# Patient Record
Sex: Female | Born: 1975 | Race: Black or African American | Hispanic: No | Marital: Single | Smoking: Former smoker
Health system: Southern US, Community
[De-identification: ages and names within clinical notes are randomized; demographics above are authoritative.]

## PROBLEM LIST (undated history)

## (undated) ENCOUNTER — Inpatient Hospital Stay (HOSPITAL_COMMUNITY): Payer: Self-pay

## (undated) DIAGNOSIS — E282 Polycystic ovarian syndrome: Secondary | ICD-10-CM

## (undated) DIAGNOSIS — Z872 Personal history of diseases of the skin and subcutaneous tissue: Secondary | ICD-10-CM

## (undated) DIAGNOSIS — Z87898 Personal history of other specified conditions: Secondary | ICD-10-CM

## (undated) DIAGNOSIS — O24419 Gestational diabetes mellitus in pregnancy, unspecified control: Secondary | ICD-10-CM

## (undated) DIAGNOSIS — E669 Obesity, unspecified: Secondary | ICD-10-CM

## (undated) HISTORY — DX: Personal history of other specified conditions: Z87.898

## (undated) HISTORY — DX: Polycystic ovarian syndrome: E28.2

## (undated) HISTORY — DX: Personal history of diseases of the skin and subcutaneous tissue: Z87.2

## (undated) HISTORY — PX: THERAPEUTIC ABORTION: SHX798

---

## 2009-06-21 ENCOUNTER — Emergency Department (HOSPITAL_COMMUNITY): Admission: EM | Admit: 2009-06-21 | Discharge: 2009-06-22 | Payer: Self-pay | Admitting: Emergency Medicine

## 2010-08-05 ENCOUNTER — Ambulatory Visit: Payer: Self-pay | Admitting: Obstetrics and Gynecology

## 2010-08-05 ENCOUNTER — Encounter: Payer: Self-pay | Admitting: Family Medicine

## 2010-08-05 LAB — CONVERTED CEMR LAB
CO2: 28 meq/L (ref 19–32)
Chloride: 104 meq/L (ref 96–112)
Hgb A1c MFr Bld: 6 % — ABNORMAL HIGH (ref <5.7)
Pap Smear: NEGATIVE
Potassium: 3.9 meq/L (ref 3.5–5.3)

## 2010-08-06 ENCOUNTER — Encounter: Payer: Self-pay | Admitting: Family Medicine

## 2010-10-08 ENCOUNTER — Ambulatory Visit: Admit: 2010-10-08 | Payer: Self-pay | Admitting: Obstetrics and Gynecology

## 2010-12-03 ENCOUNTER — Ambulatory Visit (INDEPENDENT_AMBULATORY_CARE_PROVIDER_SITE_OTHER): Payer: Self-pay | Admitting: Obstetrics and Gynecology

## 2010-12-03 DIAGNOSIS — E282 Polycystic ovarian syndrome: Secondary | ICD-10-CM

## 2010-12-07 LAB — POCT URINALYSIS DIPSTICK
Bilirubin Urine: NEGATIVE
Ketones, ur: NEGATIVE mg/dL
Specific Gravity, Urine: 1.02 (ref 1.005–1.030)

## 2010-12-14 ENCOUNTER — Inpatient Hospital Stay (HOSPITAL_COMMUNITY)
Admission: AD | Admit: 2010-12-14 | Discharge: 2010-12-15 | Disposition: A | Discharge status: Home / Self Care | Payer: Self-pay | Source: Ambulatory Visit | Attending: Obstetrics & Gynecology | Admitting: Obstetrics & Gynecology

## 2010-12-14 ENCOUNTER — Inpatient Hospital Stay (HOSPITAL_COMMUNITY): Payer: Self-pay

## 2010-12-14 DIAGNOSIS — N946 Dysmenorrhea, unspecified: Secondary | ICD-10-CM

## 2010-12-14 LAB — URINALYSIS, ROUTINE W REFLEX MICROSCOPIC
Leukocytes, UA: NEGATIVE
Nitrite: NEGATIVE
Specific Gravity, Urine: 1.015 (ref 1.005–1.030)
Urobilinogen, UA: 0.2 mg/dL (ref 0.0–1.0)
pH: 7 (ref 5.0–8.0)

## 2010-12-14 LAB — URINE MICROSCOPIC-ADD ON

## 2010-12-14 LAB — POCT PREGNANCY, URINE: Preg Test, Ur: NEGATIVE

## 2010-12-17 NOTE — Progress Notes (Unsigned)
NAMEANNAMAY, LAYMON NO.:  [id number]  MEDICAL RECORD NO.:  [id number]           PATIENT TYPE:  A  LOCATION:  [facility]                   FACILITY:  [facility]  PHYSICIAN:  Argentina Donovan, MD        DATE OF BIRTH:  1976-09-06  DATE OF SERVICE:  12/03/2010                                 CLINIC NOTE  The patient is a 35 year old gravida 3, para 0-0-3-0 with 3 teenage abortions.  She has been diagnosed with polycystic ovarian syndrome in [place], was treated with Provera and Clomid, did not quite understand. I do not think how she was taking.  We spent a lot of time with her discussing the problem, discussing the Clomid and how to use it.  We are going to put her on a temperature chart using a basal temperature thermometer.  We are going to have her take her Provera for 5 days twice a day.  When she starts her period, she is going to use 2 Clomid.  She is on one for many months, one twice a day for 5 days from day 5 through 9 of each cycle.  If she does not have a period after she takes the Clomid, she comes in.  If she does have a period, she starts again doing the same procedure, but staying on the temperature chart.  I have told her I need the temperature chart as much as I need her to come in.  IMPRESSION:  Polycystic ovarian syndrome.          ______________________________ Argentina Donovan, MD    PR/MEDQ  D:  12/03/2010  T:  12/04/2010  Job:  [id number]

## 2010-12-31 LAB — URINALYSIS, ROUTINE W REFLEX MICROSCOPIC
Bilirubin Urine: NEGATIVE
Ketones, ur: NEGATIVE mg/dL
Protein, ur: NEGATIVE mg/dL
Specific Gravity, Urine: 1.027 (ref 1.005–1.030)
Urobilinogen, UA: 1 mg/dL (ref 0.0–1.0)

## 2010-12-31 LAB — DIFFERENTIAL
Lymphs Abs: 1.4 10*3/uL (ref 0.7–4.0)
Monocytes Relative: 7 % (ref 3–12)
Neutro Abs: 4.5 10*3/uL (ref 1.7–7.7)
Neutrophils Relative %: 69 % (ref 43–77)

## 2010-12-31 LAB — BASIC METABOLIC PANEL
Calcium: 9 mg/dL (ref 8.4–10.5)
Chloride: 108 mEq/L (ref 96–112)
Creatinine, Ser: 0.87 mg/dL (ref 0.4–1.2)
GFR calc Af Amer: >60 mL/min (ref >60)
Sodium: 138 mEq/L (ref 135–145)

## 2010-12-31 LAB — CBC
MCV: 88.7 fL (ref 78.0–100.0)
RBC: 4.43 MIL/uL (ref 3.87–5.11)
WBC: 6.4 10*3/uL (ref 4.0–10.5)

## 2010-12-31 LAB — URINE MICROSCOPIC-ADD ON

## 2010-12-31 LAB — PREGNANCY, URINE: Preg Test, Ur: NEGATIVE

## 2011-03-21 ENCOUNTER — Ambulatory Visit (INDEPENDENT_AMBULATORY_CARE_PROVIDER_SITE_OTHER): Payer: Self-pay | Admitting: Obstetrics and Gynecology

## 2011-03-21 DIAGNOSIS — E282 Polycystic ovarian syndrome: Secondary | ICD-10-CM

## 2011-03-21 DIAGNOSIS — N912 Amenorrhea, unspecified: Secondary | ICD-10-CM

## 2011-03-22 NOTE — Group Therapy Note (Signed)
Danielle, Huerta NO.:  [id number]  MEDICAL RECORD NO.:  [id number]           PATIENT TYPE:  A  LOCATION:  [facility]                   FACILITY:  [facility]  PHYSICIAN:  Argentina Donovan, MD        DATE OF BIRTH:  [birth year]  DATE OF SERVICE:  03/21/2011                                 CLINIC NOTE  The patient is a polycystic ovarian syndrome patient gravida 3, para 0-0- 3-0 with 3 teenage abortions diagnosed with polycystic ovarian syndrome, treated with Provera and Clomid.  Did not quite understand how to take it.  We discussed a long time ago how to take this with her.  She is on a temperature chart, which she forgot to bring in today.  She said she took the Clomid b.i.d. May 13th to 17th and has had no period since.  We are going to get a beta HCG today, if negative we will start her on Provera again for 5 days b.i.d. 10 mg followed by Clomid 50 t.i.d. 5 to 9.  We will do that for 2 months.  We will have her come in again with her temperature chart and decide whether she needs HCG if that fails or there is nothing further we can do for her in this clinic.  IMPRESSION:  Polycystic ovarian syndrome anovulation.          ______________________________ Argentina Donovan, MD    PR/MEDQ  D:  03/21/2011  T:  03/22/2011  Job:  [id number]

## 2011-04-18 ENCOUNTER — Encounter (HOSPITAL_COMMUNITY): Payer: Self-pay | Admitting: *Deleted

## 2011-04-18 ENCOUNTER — Inpatient Hospital Stay (HOSPITAL_COMMUNITY)
Admission: AD | Admit: 2011-04-18 | Discharge: 2011-04-18 | Disposition: A | Discharge status: Home / Self Care | Payer: Self-pay | Source: Ambulatory Visit | Attending: Obstetrics & Gynecology | Admitting: Obstetrics & Gynecology

## 2011-04-18 DIAGNOSIS — B9689 Other specified bacterial agents as the cause of diseases classified elsewhere: Secondary | ICD-10-CM | POA: Insufficient documentation

## 2011-04-18 DIAGNOSIS — F17200 Nicotine dependence, unspecified, uncomplicated: Secondary | ICD-10-CM | POA: Insufficient documentation

## 2011-04-18 DIAGNOSIS — N760 Acute vaginitis: Secondary | ICD-10-CM | POA: Insufficient documentation

## 2011-04-18 DIAGNOSIS — A499 Bacterial infection, unspecified: Secondary | ICD-10-CM | POA: Insufficient documentation

## 2011-04-18 LAB — URINALYSIS, ROUTINE W REFLEX MICROSCOPIC
Glucose, UA: NEGATIVE mg/dL
Specific Gravity, Urine: >1.03 — ABNORMAL HIGH (ref 1.005–1.030)

## 2011-04-18 LAB — WET PREP, GENITAL
Trich, Wet Prep: NONE SEEN
Yeast Wet Prep HPF POC: NONE SEEN

## 2011-04-18 MED ORDER — METRONIDAZOLE 500 MG PO TABS: 500.0000 mg | ORAL_TABLET | Freq: Two times a day (BID) | ORAL | Status: AC

## 2011-04-18 NOTE — ED Provider Notes (Signed)
History   Chief Complaint:  Vaginal Discharge   Danielle Huerta is  35 y.o. G3P0030 No LMP recorded..  Her pregnancy status is negative. She presents complaining of Vaginal Discharge . Onset is described as insidious and has been present for  2 days.   OB History    Grav Para Term Preterm Abortions TAB SAB Ect Mult Living   3 0 0 0 3 0 0 0 0 0        Past Medical History  Diagnosis Date  . No pertinent past medical history     Past Surgical History  Procedure Date  . Therapeutic abortion     No family history on file.  History  Substance Use Topics  . Smoking status: Current Everyday Smoker -- 0.5 packs/day for 15 years  . Smokeless tobacco: Never Used  . Alcohol Use: Yes    Allergies:  Allergies  Allergen Reactions  . Amoxicillin Itching    No prescriptions prior to admission    Review of Systems - Negative except vaginal discharge  Physical Exam   Blood pressure 122/75, pulse 71, temperature 98.1 F (36.7 C), temperature source Oral, resp. rate 18, height 5\' 6"  (1.676 m), weight 231 lb 6.4 oz (104.962 kg).  General: General appearance - alert, well appearing, and in no distress and oriented to person, place, and time Mental status - alert, oriented to person, place, and time Abdomen - soft, nontender, nondistended, no masses or organomegaly Back exam - full range of motion, no tenderness, palpable spasm or pain on motion Neurological - alert, oriented, normal speech, no focal findings or movement disorder noted Musculoskeletal - no joint tenderness, deformity or swelling Focused Gynecological Exam: normal external genitalia, vulva, vagina, cervix, uterus and adnexa, small white creamy d/c in vault, No CMT, no adnexal tenderness, uterus non tender/non enlarged  Labs: Recent Results (from the past 24 hour(s))  URINALYSIS, ROUTINE W REFLEX MICROSCOPIC   Collection Time   04/18/11  1:05 PM      Component Value Range   Color, Urine YELLOW  YELLOW    Appearance CLOUDY (*) CLEAR    Specific Gravity, Urine >1.030 (*) 1.005 - 1.030    pH 5.5  5.0 - 8.0    Glucose, UA NEGATIVE  NEGATIVE (mg/dL)   Hgb urine dipstick SMALL (*) NEGATIVE    Bilirubin Urine SMALL (*) NEGATIVE    Ketones, ur NEGATIVE  NEGATIVE (mg/dL)   Protein, ur NEGATIVE  NEGATIVE (mg/dL)   Urobilinogen, UA 0.2  0.0 - 1.0 (mg/dL)   Nitrite POSITIVE (*) NEGATIVE    Leukocytes, UA SMALL (*) NEGATIVE   URINE MICROSCOPIC-ADD ON   Collection Time   04/18/11  1:05 PM      Component Value Range   Squamous Epithelial / LPF FEW (*) RARE    WBC, UA 11-20  <3 (WBC/hpf)   Bacteria, UA MANY (*) RARE    Urine-Other MUCOUS PRESENT    WET PREP, GENITAL   Collection Time   04/18/11  1:11 PM      Component Value Range   Yeast, Wet Prep NONE SEEN  NONE SEEN    Trich, Wet Prep NONE SEEN  NONE SEEN    Clue Cells, Wet Prep FEW (*) NONE SEEN    WBC, Wet Prep HPF POC MODERATE (*) NONE SEEN   GC/CHLAMYDIA PROBE AMP, GENITAL   Collection Time   04/18/11  1:11 PM      Component Value Range   GC Probe Amp, Genital NEGATIVE  NEGATIVE    Chlamydia, DNA Probe NEGATIVE  NEGATIVE   POCT PREGNANCY, URINE   Collection Time   04/18/11  1:11 PM      Component Value Range   Preg Test, Ur NEGATIVE      Assessment: Patient Active Problem List  Diagnoses  . Bacterial vaginal infection     Plan: Rx Flagyl 500mg  po BID x 7 days, ETOH precautions reviewed FU in Gyn clinic as needed   Discharge Medications: Flagyl    Danielle Huerta E. 04/19/2011, 9:42 AM

## 2011-04-18 NOTE — Progress Notes (Signed)
2days ago noted urine has an odor, about same time noted a thick, white non- odorous vag d/c.  Denies itching or burning.  +frequency with urination, no urgency or pain

## 2011-04-18 NOTE — ED Notes (Signed)
Spotted on Sat. LMP was in May,  Saw Dr in June- neg blood preg test

## 2011-04-19 LAB — GC/CHLAMYDIA PROBE AMP, GENITAL: GC Probe Amp, Genital: NEGATIVE

## 2011-09-14 ENCOUNTER — Ambulatory Visit: Payer: Self-pay | Admitting: Advanced Practice Midwife

## 2012-06-27 ENCOUNTER — Encounter (HOSPITAL_COMMUNITY): Payer: Self-pay | Admitting: *Deleted

## 2012-06-27 ENCOUNTER — Inpatient Hospital Stay (HOSPITAL_COMMUNITY)
Admission: AD | Admit: 2012-06-27 | Discharge: 2012-06-27 | Disposition: A | Discharge status: Home / Self Care | Payer: BC Managed Care – PPO | Source: Ambulatory Visit | Attending: Obstetrics & Gynecology | Admitting: Obstetrics & Gynecology

## 2012-06-27 ENCOUNTER — Inpatient Hospital Stay (HOSPITAL_COMMUNITY): Payer: BC Managed Care – PPO

## 2012-06-27 DIAGNOSIS — N938 Other specified abnormal uterine and vaginal bleeding: Secondary | ICD-10-CM | POA: Insufficient documentation

## 2012-06-27 DIAGNOSIS — N949 Unspecified condition associated with female genital organs and menstrual cycle: Secondary | ICD-10-CM

## 2012-06-27 LAB — WET PREP, GENITAL
Trich, Wet Prep: NONE SEEN
Yeast Wet Prep HPF POC: NONE SEEN

## 2012-06-27 LAB — CBC
MCHC: 32.9 g/dL (ref 30.0–36.0)
Platelets: 238 10*3/uL (ref 150–400)
RDW: 12.6 % (ref 11.5–15.5)
WBC: 5.2 10*3/uL (ref 4.0–10.5)

## 2012-06-27 LAB — URINALYSIS, ROUTINE W REFLEX MICROSCOPIC
Glucose, UA: NEGATIVE mg/dL
Protein, ur: NEGATIVE mg/dL
pH: 6 (ref 5.0–8.0)

## 2012-06-27 LAB — URINE MICROSCOPIC-ADD ON

## 2012-06-27 LAB — POCT PREGNANCY, URINE: Preg Test, Ur: NEGATIVE

## 2012-06-27 MED ORDER — NORGESTIMATE-ETH ESTRADIOL 0.25-35 MG-MCG PO TABS: 1.0000 | ORAL_TABLET | Freq: Every day | ORAL | Status: DC

## 2012-06-27 MED ORDER — IBUPROFEN 600 MG PO TABS: 600.0000 mg | ORAL_TABLET | Freq: Four times a day (QID) | ORAL | Status: DC | PRN

## 2012-06-27 NOTE — MAU Provider Note (Signed)
Agree with note ARNOLD,JAMES  

## 2012-06-27 NOTE — MAU Note (Signed)
Bleeding X 2 weeks. On Sunday, 9/28, clots were coming out every 5 mins. "the size of my hand." Since then has been medium-heavy flow. States has never happened before. Not on any type of birth control. Wants to become pregnant.

## 2012-06-27 NOTE — MAU Provider Note (Signed)
History     CSN: [id number]  Arrival date and time: 06/27/12 1622   First Provider Initiated Contact with Patient 06/27/12 1917      Chief Complaint  Patient presents with  . Vaginal Bleeding   HPI Danielle Huerta is a 36 y.o. female who presents to MAU with vaginal bleeding. The bleeding started 2 weeks ago. She describes the bleeding as going from heavy to light and then 4 days ago  back to heavy and  passing clots. Last normal period 05/25/12. Last pap smear about a year ago and was normal at [facility]. Current sex partner x 3 years, no birth control. No history of STI's. No pain with intercourse. The history was provided by the patient.  OB History    Grav Para Term Preterm Abortions TAB SAB Ect Mult Living   3 0 0 0 3 3 0 0 0 0       Past Medical History  Diagnosis Date  . Diabetes mellitus     elevated A1C, to F/U with family MD    Past Surgical History  Procedure Date  . Therapeutic abortion     History reviewed. No pertinent family history.  History  Substance Use Topics  . Smoking status: Current Every Day Smoker -- 0.2 packs/day for 15 years  . Smokeless tobacco: Never Used  . Alcohol Use: Yes     socially    Allergies:  Allergies  Allergen Reactions  . Amoxicillin Itching    Prescriptions prior to admission  Medication Sig Dispense Refill  . acetaminophen (TYLENOL) 500 MG tablet Take 1,000 mg by mouth every 6 (six) hours as needed. For pain      . ibuprofen (ADVIL,MOTRIN) 200 MG tablet Take 400 mg by mouth every 6 (six) hours as needed. For pain        Review of Systems  Constitutional: Negative for fever, chills and weight loss.  HENT: Negative for ear pain, nosebleeds, congestion, sore throat and neck pain.   Eyes: Negative for blurred vision, double vision, photophobia and pain.  Respiratory: Negative for cough, shortness of breath and wheezing.   Cardiovascular: Negative for chest pain, palpitations and leg swelling.  Gastrointestinal:  Negative for heartburn, nausea, vomiting, abdominal pain (mild cramping), diarrhea and constipation.  Genitourinary: Negative for dysuria, urgency and frequency.  Musculoskeletal: Negative for myalgias and back pain.  Skin: Negative for itching and rash.  Neurological: Negative for dizziness, sensory change, speech change, seizures, weakness and headaches.  Endo/Heme/Allergies: Does not bruise/bleed easily.  Psychiatric/Behavioral: Negative for depression. The patient is not nervous/anxious and does not have insomnia.    Blood pressure 121/78, pulse 90, temperature 98 F (36.7 C), temperature source Oral, resp. rate 18, height 5' 4.5" (1.638 m), weight 237 lb (107.502 kg), SpO2 99.00%.  Physical Exam  Nursing note and vitals reviewed. Constitutional: She is oriented to person, place, and time. She appears well-developed and well-nourished. No distress.  HENT:  Head: Normocephalic and atraumatic.  Eyes: EOM are normal.  Neck: Neck supple.  Cardiovascular: Normal rate.   Respiratory: Effort normal.  GI: Soft. There is no tenderness.  Genitourinary:       External genitalia without lesions. Frothy, bloody discharge vaginal vault. No CMT, no adnexal tenderness. Uterus without palpable enlargement.  Musculoskeletal: Normal range of motion.  Neurological: She is alert and oriented to person, place, and time.  Skin: Skin is warm and dry.  Psychiatric: She has a normal mood and affect. Her behavior is normal. Judgment and thought  content normal.   Procedures  Results for orders placed during the hospital encounter of 06/27/12 (from the past 24 hour(s))  URINALYSIS, ROUTINE W REFLEX MICROSCOPIC     Status: Abnormal   Collection Time   06/27/12  4:49 PM      Component Value Range   Color, Urine YELLOW  YELLOW   APPearance CLEAR  CLEAR   Specific Gravity, Urine 1.010  1.005 - 1.030   pH 6.0  5.0 - 8.0   Glucose, UA NEGATIVE  NEGATIVE mg/dL   Hgb urine dipstick LARGE (*) NEGATIVE    Bilirubin Urine NEGATIVE  NEGATIVE   Ketones, ur NEGATIVE  NEGATIVE mg/dL   Protein, ur NEGATIVE  NEGATIVE mg/dL   Urobilinogen, UA 0.2  0.0 - 1.0 mg/dL   Nitrite NEGATIVE  NEGATIVE   Leukocytes, UA TRACE (*) NEGATIVE  URINE MICROSCOPIC-ADD ON     Status: Abnormal   Collection Time   06/27/12  4:49 PM      Component Value Range   Squamous Epithelial / LPF MANY (*) RARE   WBC, UA 3-6  <3 WBC/hpf   RBC / HPF 7-10  <3 RBC/hpf   Bacteria, UA MANY (*) RARE   Urine-Other MUCOUS PRESENT    CBC     Status: Normal   Collection Time   06/27/12  4:55 PM      Component Value Range   WBC 5.2  4.0 - 10.5 K/uL   RBC 4.29  3.87 - 5.11 MIL/uL   Hemoglobin 12.1  12.0 - 15.0 g/dL   HCT 40.9  81.1 - 91.4 %   MCV 85.8  78.0 - 100.0 fL   MCH 28.2  26.0 - 34.0 pg   MCHC 32.9  30.0 - 36.0 g/dL   RDW 78.2  95.6 - 21.3 %   Platelets 238  150 - 400 K/uL  POCT PREGNANCY, URINE     Status: Normal   Collection Time   06/27/12  5:26 PM      Component Value Range   Preg Test, Ur NEGATIVE  NEGATIVE   Assessment: 36 y.o. female with abnormal vaginal bleeding  Plan:  Patient awaiting ultrasound, care turned over to D. Mahayla Haddaway, CNM  NEESE,HOPE, RN, FNP, [facility] 06/27/2012, 7:17 PM   *RADIOLOGY REPORT*  Clinical Data: 36 year old female with vaginal bleeding. Negative  pregnancy test.  TRANSABDOMINAL AND TRANSVAGINAL ULTRASOUND OF PELVIS  Technique: Both transabdominal and transvaginal ultrasound  examinations of the pelvis were performed. Transabdominal technique  was performed for global imaging of the pelvis including uterus,  ovaries, adnexal regions, and pelvic cul-de-sac.  It was necessary to proceed with endovaginal exam following the  transabdominal exam to visualize the endometrium and ovaries.  Comparison: 12/14/2010  Findings:  Uterus: The uterus is anteverted measuring 7.8 x 3.5 x 5.1 cm. No  myometrial masses are identified.  Endometrium: Focal thickening of the endometrium is identified in  the  lower uterine segment measuring 11 mm in greatest diameter.  The remainder of the endometrium measures 4 mm in thickness.  Right ovary: The right ovary is unremarkable measuring 3.5 x 2.1 x  2.6 cm.  Left ovary: The left ovary is unremarkable measuring 3.2 x 2.3 x  2.7 cm.  Other findings: There is no evidence of free fluid or adnexal mass.  IMPRESSION:  Focal thickening of the endometrium in the lower uterine segment -  suspicious for focal lesion/polyp. Recommend further evaluation /  sampling.  Original Report Authenticated By: Rosendo Gros, M.D.  D/W Dr. Debroah Loop: prescribed combination oral contraceptives to take 2 a day for 7 days then take one a day and to skip the placebo week. She has an appointment October 24 with [facility]. Advised to take ibuprofen up to 600 mg every 6 hours if needed for cramping. [place] result and  AVS on DUB reviewed Danae Orleans, CNM 06/27/2012 9:51 PM

## 2012-07-30 ENCOUNTER — Other Ambulatory Visit: Payer: Self-pay | Admitting: Obstetrics and Gynecology

## 2012-08-10 ENCOUNTER — Other Ambulatory Visit: Payer: Self-pay | Admitting: Obstetrics and Gynecology

## 2012-08-13 ENCOUNTER — Encounter (HOSPITAL_COMMUNITY): Payer: Self-pay | Admitting: Pharmacist

## 2012-08-15 MED ORDER — METRONIDAZOLE IN NACL 5-0.79 MG/ML-% IV SOLN
500.0000 mg | INTRAVENOUS | Status: AC
  Administered 2012-08-16: 500 mg via INTRAVENOUS
  Filled 2012-08-15: qty 100

## 2012-08-15 MED ORDER — CIPROFLOXACIN IN D5W 400 MG/200ML IV SOLN
400.0000 mg | INTRAVENOUS | Status: AC
  Administered 2012-08-16: 400 mg via INTRAVENOUS
  Filled 2012-08-15: qty 200

## 2012-08-16 ENCOUNTER — Encounter (HOSPITAL_COMMUNITY): Payer: Self-pay | Admitting: Anesthesiology

## 2012-08-16 ENCOUNTER — Ambulatory Visit (HOSPITAL_COMMUNITY): Payer: BC Managed Care – PPO | Admitting: Anesthesiology

## 2012-08-16 ENCOUNTER — Encounter (HOSPITAL_COMMUNITY)
Admission: RE | Disposition: A | Discharge status: Home / Self Care | Payer: Self-pay | Source: Ambulatory Visit | Attending: Obstetrics and Gynecology

## 2012-08-16 ENCOUNTER — Ambulatory Visit (HOSPITAL_COMMUNITY)
Admission: RE | Admit: 2012-08-16 | Discharge: 2012-08-16 | Disposition: A | Discharge status: Home / Self Care | Payer: BC Managed Care – PPO | Source: Ambulatory Visit | Attending: Obstetrics and Gynecology | Admitting: Obstetrics and Gynecology

## 2012-08-16 ENCOUNTER — Encounter (HOSPITAL_COMMUNITY): Payer: Self-pay | Admitting: *Deleted

## 2012-08-16 DIAGNOSIS — N840 Polyp of corpus uteri: Secondary | ICD-10-CM | POA: Insufficient documentation

## 2012-08-16 DIAGNOSIS — N938 Other specified abnormal uterine and vaginal bleeding: Secondary | ICD-10-CM | POA: Insufficient documentation

## 2012-08-16 DIAGNOSIS — N949 Unspecified condition associated with female genital organs and menstrual cycle: Secondary | ICD-10-CM | POA: Insufficient documentation

## 2012-08-16 DIAGNOSIS — N920 Excessive and frequent menstruation with regular cycle: Secondary | ICD-10-CM | POA: Insufficient documentation

## 2012-08-16 DIAGNOSIS — Z9889 Other specified postprocedural states: Secondary | ICD-10-CM

## 2012-08-16 HISTORY — PX: DILATATION & CURRETTAGE/HYSTEROSCOPY WITH RESECTOCOPE: SHX5572

## 2012-08-16 LAB — HCG, SERUM, QUALITATIVE: Preg, Serum: NEGATIVE

## 2012-08-16 LAB — CBC
Platelets: 306 10*3/uL (ref 150–400)
RBC: 3.94 MIL/uL (ref 3.87–5.11)
RDW: 13.5 % (ref 11.5–15.5)
WBC: 4.9 10*3/uL (ref 4.0–10.5)

## 2012-08-16 SURGERY — DILATATION & CURETTAGE/HYSTEROSCOPY WITH RESECTOCOPE
Anesthesia: General | Wound class: Clean Contaminated

## 2012-08-16 MED ORDER — PROPOFOL 10 MG/ML IV EMUL
INTRAVENOUS | Status: AC
  Filled 2012-08-16: qty 20

## 2012-08-16 MED ORDER — LACTATED RINGERS IV SOLN
INTRAVENOUS | Status: DC
  Administered 2012-08-16 (×2): via INTRAVENOUS

## 2012-08-16 MED ORDER — FENTANYL CITRATE 0.05 MG/ML IJ SOLN
25.0000 ug | INTRAMUSCULAR | Status: DC | PRN
  Administered 2012-08-16: 50 ug via INTRAVENOUS

## 2012-08-16 MED ORDER — DEXAMETHASONE SODIUM PHOSPHATE 10 MG/ML IJ SOLN
INTRAMUSCULAR | Status: AC
  Filled 2012-08-16: qty 1

## 2012-08-16 MED ORDER — METOCLOPRAMIDE HCL 5 MG/ML IJ SOLN: 10.0000 mg | Freq: Once | INTRAMUSCULAR | Status: DC | PRN

## 2012-08-16 MED ORDER — KETOROLAC TROMETHAMINE 30 MG/ML IJ SOLN
INTRAMUSCULAR | Status: AC
  Filled 2012-08-16: qty 1

## 2012-08-16 MED ORDER — FENTANYL CITRATE 0.05 MG/ML IJ SOLN
INTRAMUSCULAR | Status: AC
  Filled 2012-08-16: qty 4

## 2012-08-16 MED ORDER — MIDAZOLAM HCL 5 MG/5ML IJ SOLN
INTRAMUSCULAR | Status: DC | PRN
  Administered 2012-08-16: 2 mg via INTRAVENOUS

## 2012-08-16 MED ORDER — LIDOCAINE HCL (CARDIAC) 20 MG/ML IV SOLN
INTRAVENOUS | Status: AC
  Filled 2012-08-16: qty 5

## 2012-08-16 MED ORDER — MEPERIDINE HCL 25 MG/ML IJ SOLN: 6.2500 mg | INTRAMUSCULAR | Status: DC | PRN

## 2012-08-16 MED ORDER — PROPOFOL 10 MG/ML IV EMUL
INTRAVENOUS | Status: DC | PRN
  Administered 2012-08-16: 200 mg via INTRAVENOUS

## 2012-08-16 MED ORDER — SODIUM CHLORIDE 0.9 % IR SOLN
Status: DC | PRN
  Administered 2012-08-16: 3000 mL

## 2012-08-16 MED ORDER — ONDANSETRON HCL 4 MG/2ML IJ SOLN
INTRAMUSCULAR | Status: DC | PRN
  Administered 2012-08-16: 4 mg via INTRAVENOUS

## 2012-08-16 MED ORDER — DEXAMETHASONE SODIUM PHOSPHATE 10 MG/ML IJ SOLN
INTRAMUSCULAR | Status: DC | PRN
  Administered 2012-08-16: 10 mg via INTRAVENOUS

## 2012-08-16 MED ORDER — MIDAZOLAM HCL 2 MG/2ML IJ SOLN
INTRAMUSCULAR | Status: AC
  Filled 2012-08-16: qty 2

## 2012-08-16 MED ORDER — LIDOCAINE HCL (CARDIAC) 20 MG/ML IV SOLN
INTRAVENOUS | Status: DC | PRN
  Administered 2012-08-16: 50 mg via INTRAVENOUS

## 2012-08-16 MED ORDER — ONDANSETRON HCL 4 MG/2ML IJ SOLN
INTRAMUSCULAR | Status: AC
  Filled 2012-08-16: qty 2

## 2012-08-16 MED ORDER — FENTANYL CITRATE 0.05 MG/ML IJ SOLN
INTRAMUSCULAR | Status: DC | PRN
  Administered 2012-08-16: 100 ug via INTRAVENOUS

## 2012-08-16 MED ORDER — LIDOCAINE HCL 2 % IJ SOLN
INTRAMUSCULAR | Status: DC | PRN
  Administered 2012-08-16: 2 mL

## 2012-08-16 MED ORDER — LIDOCAINE HCL 2 % IJ SOLN
INTRAMUSCULAR | Status: AC
  Filled 2012-08-16: qty 20

## 2012-08-16 MED ORDER — KETOROLAC TROMETHAMINE 30 MG/ML IJ SOLN
INTRAMUSCULAR | Status: DC | PRN
  Administered 2012-08-16: 30 mg via INTRAVENOUS

## 2012-08-16 SURGICAL SUPPLY — 19 items
BLADE INCISOR TRUC PLUS 2.9 (ABLATOR) ×1 IMPLANT
CANISTER SUCTION 2500CC (MISCELLANEOUS) ×2 IMPLANT
CATH ROBINSON RED A/P 16FR (CATHETERS) ×2 IMPLANT
CLOTH BEACON ORANGE TIMEOUT ST (SAFETY) ×2 IMPLANT
CONTAINER PREFILL 10% NBF 60ML (FORM) ×4 IMPLANT
DILATOR CANAL MILEX (MISCELLANEOUS) IMPLANT
DRESSING TELFA 8X3 (GAUZE/BANDAGES/DRESSINGS) ×2 IMPLANT
GLOVE BIO SURGEON STRL SZ 6.5 (GLOVE) ×2 IMPLANT
GLOVE BIOGEL PI IND STRL 7.0 (GLOVE) ×5 IMPLANT
GLOVE BIOGEL PI INDICATOR 7.0 (GLOVE) ×5
GLOVE NEODERM STER SZ 7 (GLOVE) ×4 IMPLANT
GOWN STRL REIN XL XLG (GOWN DISPOSABLE) ×6 IMPLANT
INCISOR TRUC PLUS BLADE 2.9 (ABLATOR) ×2
KIT HYSTEROSCOPY TRUCLEAR (ABLATOR) ×2 IMPLANT
LOOP ANGLED CUTTING 22FR (CUTTING LOOP) IMPLANT
PACK HYSTEROSCOPY LF (CUSTOM PROCEDURE TRAY) ×2 IMPLANT
PAD OB MATERNITY 4.3X12.25 (PERSONAL CARE ITEMS) ×2 IMPLANT
TOWEL OR 17X24 6PK STRL BLUE (TOWEL DISPOSABLE) ×4 IMPLANT
WATER STERILE IRR 1000ML POUR (IV SOLUTION) ×2 IMPLANT

## 2012-08-16 NOTE — Transfer of Care (Signed)
Immediate Anesthesia Transfer of Care Note  Patient: Danielle Huerta  Procedure(s) Performed: Procedure(s) (LRB) with comments: DILATATION & CURETTAGE/HYSTEROSCOPY WITH RESECTOCOPE (N/A)  Patient Location: PACU  Anesthesia Type:General  Level of Consciousness: awake, alert , oriented and patient cooperative  Airway & Oxygen Therapy: Patient Spontanous Breathing and Patient connected to nasal cannula oxygen  Post-op Assessment: Report given to PACU RN and Post -op Vital signs reviewed and stable  Post vital signs: Reviewed and stable  Complications: No apparent anesthesia complications

## 2012-08-16 NOTE — H&P (View-Only) (Signed)
Patient ID: Danielle Huerta, female   DOB: 05/10/1976, 36 y.o.   MRN: [id number]  07/30/2012  History of Present Illness  General:          36 y/o G2A2 presents for EMB due to dysfunctional bleeding. Pt has been on OCP taper and Provera previously to manage bleeing. Ultrasound shows a focal mass, possibly a polyp.         Pt has recently used Clomid for infertility due to a known h/o PCOS.        Currently denies SOB,dizziness or chest pain. Denies pelvic pain.    Current Medications  Norgestimate-Eth Estradiol 0.25-35 MG-MCG Tablet 1 tablet Once a day, stop date 07/23/2012  Provera 10 MG Tablet 1 tablet once daily  Medication List reviewed and reconciled with the patient      Past Medical History  Infertility  Hirsutism      Surgical History  Denies Past Surgical History      Family History  Father: deceased Diabetes   Mother: alive   Maternal aunt: alive Breast cancer       Social History  General:         History of smoking               cigarettes:  Current smoker             Frequency:  1/2 PPD       Smoking: yes.        Alcohol: yes, Rare.        no Recreational drug use.        Exercise: yes, 2-3 x weekly.        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        Marital Status: Divorced, committed relationship.        Children: none.        Religion: Baptist.        Seat belt use: yes.     Gyn History  Sexual activity  currently sexually active.   Periods :  every other month until 05/20/12, bled until 05/30/12, restarted 06/16/12 and bled til 07/22/12.   LMP  07/22/12.   Birth control  ocp.   Last pap smear date  1 year ago.   Denies H/O Last mammogram date  .   Abnormal pap smear  none.   STD  none.      OB History  abortion  2.   Pregnancy # 1  abortion at 1-2 months, D&C, was age 16.   Pregnancy # 2  abortion at less than month, D&C.      Allergies  N.K.D.A.      Hospitalization/Major Diagnostic Procedure    Denies Past Hospitalization      Review of Systems  See HPI. See HPI.    Vital Signs  Wt 230, Ht 65.25, BMI 37.98, Pulse sitting 91, BP sitting 129/80.    Physical Examination  GENERAL:          Patient appears  alert and oriented.          General Appearance:  well-appearing, well-developed, no acute distress.          Speech:  clear.   FEMALE GENITOURINARY:          Cervix  visualized, healthy appearing, no discharge, no lesions.          Adnexa:  no mass, non tender.          Uterus:    normal size/shape/consistency, freely mobile, non tender.          Vagina:  pink/moist mucosa, no lesions, no abnormal discharge.          Vulva:  normal, no lesions, no skin discoloration.          Anus:  no external hemosrhoids.   EXTREMITIES:          general  no edema.           Assessments  1. Dysfunctional Uterine Bleeding - 626.8 (Primary)  2. Pregnancy test negative - V72.41    Treatment  1. Dysfunctional Uterine Bleeding   Continue Provera Tablet, 10 MG, 1 tablet, Orally, once daily, 30 days, 30, Refills 0 Discussed R/B/A of hysteroscopy D&C. Will proceed if EMB is normal. All questions answered.      2. Pregnancy test negative        LAB: Pregnancy Test, Urine     Pregnancy Test, Urine negative               Allman,Michelle 07/30/2012 11:32:47 AM > Dr. V infomred.        Procedures  Endometrial Biopsy:          Consent UTP negative, Informed consent obtained.          Prep cervix was prepped with betadine.          Procedure Cervix was grasped with single tooth tenaculum, uterus was sounded to 7-8 cm, a 4 mm pipet was advanced without difficulty, with good return of tissue.          Post procedure Patient tolerated procedure well.          Indication DUB, endometrial mass.         Procedure Codes  81025 URINE PREGNANCY TEST      Follow Up  2 Weeks post op    History of Present Illness  General:          36 y/o G2A2 presents for EMB due to dysfunctional bleeding.  Pt has been on OCP taper and Provera previously to manage bleeing. Ultrasound shows a focal mass, possibly a polyp.         Pt has recently used Clomid for infertility due to a known h/o PCOS.        Currently denies SOB,dizziness or chest pain. Denies pelvic pain.    Current Medications  Norgestimate-Eth Estradiol 0.25-35 MG-MCG Tablet 1 tablet Once a day, stop date 07/23/2012  Provera 10 MG Tablet 1 tablet once daily  Medication List reviewed and reconciled with the patient      Past Medical History  Infertility  Hirsutism      Surgical History  Denies Past Surgical History      Family History  Father: deceased Diabetes   Mother: alive   Maternal aunt: alive Breast cancer       Social History  General:         History of smoking               cigarettes:  Current smoker             Frequency:  1/2 PPD       Smoking: yes.        Alcohol: yes, Rare.        no Recreational drug use.        Exercise: yes, 2-3 x weekly.        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        Marital Status: Divorced, committed relationship.        Children: none.        Religion: Baptist.        Seat belt use: yes.     Gyn History  Sexual activity  currently sexually active.   Periods :  every other month until 05/20/12, bled until 05/30/12, restarted 06/16/12 and bled til 07/22/12.   LMP  07/22/12.   Birth control  ocp.   Last pap smear date  1 year ago.   Denies H/O Last mammogram date  .   Abnormal pap smear  none.   STD  none.      OB History  abortion  2.   Pregnancy # 1  abortion at 1-2 months, D&C, was age 16.   Pregnancy # 2  abortion at less than month, D&C.      Allergies  N.K.D.A.      Hospitalization/Major Diagnostic Procedure  Denies Past Hospitalization      Review of Systems  See HPI. See HPI.    Vital Signs  Wt 230, Ht 65.25, BMI 37.98, Pulse sitting 91, BP sitting 129/80.    Physical Examination  GENERAL:           Patient appears  alert and oriented.          General Appearance:  well-appearing, well-developed, no acute distress.          Speech:  clear.   FEMALE GENITOURINARY:          Cervix  visualized, healthy appearing, no discharge, no lesions.          Adnexa:  no mass, non tender.          Uterus:  normal size/shape/consistency, freely mobile, non tender.          Vagina:  pink/moist mucosa, no lesions, no abnormal discharge.          Vulva:  normal, no lesions, no skin discoloration.          Anus:  no external hemosrhoids.   EXTREMITIES:          general  no edema.           Assessments  1. Dysfunctional Uterine Bleeding - 626.8 (Primary)  2. Pregnancy test negative - V72.41    Treatment  1. Dysfunctional Uterine Bleeding   Continue Provera Tablet, 10 MG, 1 tablet, Orally, once daily, 30 days, 30, Refills 0 Discussed R/B/A of hysteroscopy D&C. Will proceed if EMB is normal. All questions answered.      2. Pregnancy test negative        LAB: Pregnancy Test, Urine     Pregnancy Test, Urine negative               Allman,Michelle 07/30/2012 11:32:47 AM > Dr. V infomred.        Procedures  Endometrial Biopsy:          Consent UTP negative, Informed consent obtained.          Prep cervix was prepped with betadine.          Procedure Cervix was grasped with single tooth tenaculum, uterus was sounded to 7-8 cm, a 4 mm pipet was advanced without difficulty, with good return of tissue.          Post procedure Patient tolerated procedure well.          Indication DUB, endometrial mass.           Procedure Codes  81025 URINE PREGNANCY TEST      Follow Up  2 Weeks post op     

## 2012-08-16 NOTE — Anesthesia Postprocedure Evaluation (Signed)
  Anesthesia Post-op Note  Patient: Danielle Huerta  Procedure(s) Performed: Procedure(s) (LRB) with comments: DILATATION & CURETTAGE/HYSTEROSCOPY WITH RESECTOCOPE (N/A)  Patient Location: PACU  Anesthesia Type:General  Level of Consciousness: awake, alert  and oriented  Airway and Oxygen Therapy: Patient Spontanous Breathing  Post-op Pain: none  Post-op Assessment: Post-op Vital signs reviewed, Patient's Cardiovascular Status Stable, Respiratory Function Stable, Patent Airway, No signs of Nausea or vomiting and Pain level controlled  Post-op Vital Signs: Reviewed and stable  Complications: No apparent anesthesia complications

## 2012-08-16 NOTE — Progress Notes (Signed)
Patient ID: Danielle Huerta, female   DOB: 10-04-1975, 36 y.o.   MRN: [id number]  07/30/2012  History of Present Illness  General:          36 y/o G2A2 presents for EMB due to dysfunctional bleeding. Pt has been on OCP taper and Provera previously to manage bleeing. Ultrasound shows a focal mass, possibly a polyp.         Pt has recently used Clomid for infertility due to a known h/o PCOS.        Currently denies SOB,dizziness or chest pain. Denies pelvic pain.    Current Medications  Norgestimate-Eth Estradiol 0.25-35 MG-MCG Tablet 1 tablet Once a day, stop date 07/23/2012  Provera 10 MG Tablet 1 tablet once daily  Medication List reviewed and reconciled with the patient      Past Medical History  Infertility  Hirsutism      Surgical History  Denies Past Surgical History      Family History  Father: deceased Diabetes   Mother: alive   Maternal aunt: alive Breast cancer       Social History  General:         History of smoking               cigarettes:  Current smoker             Frequency:  1/2 PPD       Smoking: yes.        Alcohol: yes, Rare.        no Recreational drug use.        Exercise: yes, 2-3 x weekly.        Occupation: Radiation protection practitioner for [employer], works with mentally challenged adults.        Marital Status: Divorced, committed relationship.        Children: none.        Religion: Baptist.        Seat belt use: yes.     Gyn History  Sexual activity  currently sexually active.   Periods :  every other month until 05/20/12, bled until 05/30/12, restarted 06/16/12 and bled til 07/22/12.   LMP  07/22/12.   Birth control  ocp.   Last pap smear date  1 year ago.   Denies H/O Last mammogram date  .   Abnormal pap smear  none.   STD  none.      OB History  abortion  2.   Pregnancy # 1  abortion at 1-2 months, D&C, was age 62.   Pregnancy # 2  abortion at less than month, D&C.      Allergies  N.K.D.A.      Hospitalization/Major Diagnostic Procedure    Denies Past Hospitalization      Review of Systems  See HPI. See HPI.    Vital Signs  Wt 230, Ht 65.25, BMI 37.98, Pulse sitting 91, BP sitting 129/80.    Physical Examination  GENERAL:          Patient appears  alert and oriented.          General Appearance:  well-appearing, well-developed, no acute distress.          Speech:  clear.   FEMALE GENITOURINARY:          Cervix  visualized, healthy appearing, no discharge, no lesions.          Adnexa:  no mass, non tender.          Uterus:  normal size/shape/consistency, freely mobile, non tender.          Vagina:  pink/moist mucosa, no lesions, no abnormal discharge.          Vulva:  normal, no lesions, no skin discoloration.          Anus:  no external hemosrhoids.   EXTREMITIES:          general  no edema.           Assessments  1. Dysfunctional Uterine Bleeding - 626.8 (Primary)  2. Pregnancy test negative - V72.41    Treatment  1. Dysfunctional Uterine Bleeding   Continue Provera Tablet, 10 MG, 1 tablet, Orally, once daily, 30 days, 30, Refills 0 Discussed R/B/A of hysteroscopy D&C. Will proceed if EMB is normal. All questions answered.      2. Pregnancy test negative        LAB: Pregnancy Test, Urine     Pregnancy Test, Urine negative               Allman,Michelle 07/30/2012 11:32:47 AM > Dr. Seth Bake infomred.        Procedures  Endometrial Biopsy:          Consent UTP negative, Informed consent obtained.          Prep cervix was prepped with betadine.          Procedure Cervix was grasped with single tooth tenaculum, uterus was sounded to 7-8 cm, a 4 mm pipet was advanced without difficulty, with good return of tissue.          Post procedure Patient tolerated procedure well.          Indication DUB, endometrial mass.         Procedure Codes  14782 URINE PREGNANCY TEST      Follow Up  2 Weeks post op    History of Present Illness  General:          36 y/o G2A2 presents for EMB due to dysfunctional bleeding.  Pt has been on OCP taper and Provera previously to manage bleeing. Ultrasound shows a focal mass, possibly a polyp.         Pt has recently used Clomid for infertility due to a known h/o PCOS.        Currently denies SOB,dizziness or chest pain. Denies pelvic pain.    Current Medications  Norgestimate-Eth Estradiol 0.25-35 MG-MCG Tablet 1 tablet Once a day, stop date 07/23/2012  Provera 10 MG Tablet 1 tablet once daily  Medication List reviewed and reconciled with the patient      Past Medical History  Infertility  Hirsutism      Surgical History  Denies Past Surgical History      Family History  Father: deceased Diabetes   Mother: alive   Maternal aunt: alive Breast cancer       Social History  General:         History of smoking               cigarettes:  Current smoker             Frequency:  1/2 PPD       Smoking: yes.        Alcohol: yes, Rare.        no Recreational drug use.        Exercise: yes, 2-3 x weekly.        Occupation: Radiation protection practitioner for [employer], works with mentally  challenged adults.        Marital Status: Divorced, committed relationship.        Children: none.        Religion: Baptist.        Seat belt use: yes.     Gyn History  Sexual activity  currently sexually active.   Periods :  every other month until 05/20/12, bled until 05/30/12, restarted 06/16/12 and bled til 07/22/12.   LMP  07/22/12.   Birth control  ocp.   Last pap smear date  1 year ago.   Denies H/O Last mammogram date  .   Abnormal pap smear  none.   STD  none.      OB History  abortion  2.   Pregnancy # 1  abortion at 1-2 months, D&C, was age 89.   Pregnancy # 2  abortion at less than month, D&C.      Allergies  N.K.D.A.      Hospitalization/Major Diagnostic Procedure  Denies Past Hospitalization      Review of Systems  See HPI. See HPI.    Vital Signs  Wt 230, Ht 65.25, BMI 37.98, Pulse sitting 91, BP sitting 129/80.    Physical Examination  GENERAL:           Patient appears  alert and oriented.          General Appearance:  well-appearing, well-developed, no acute distress.          Speech:  clear.   FEMALE GENITOURINARY:          Cervix  visualized, healthy appearing, no discharge, no lesions.          Adnexa:  no mass, non tender.          Uterus:  normal size/shape/consistency, freely mobile, non tender.          Vagina:  pink/moist mucosa, no lesions, no abnormal discharge.          Vulva:  normal, no lesions, no skin discoloration.          Anus:  no external hemosrhoids.   EXTREMITIES:          general  no edema.           Assessments  1. Dysfunctional Uterine Bleeding - 626.8 (Primary)  2. Pregnancy test negative - V72.41    Treatment  1. Dysfunctional Uterine Bleeding   Continue Provera Tablet, 10 MG, 1 tablet, Orally, once daily, 30 days, 30, Refills 0 Discussed R/B/A of hysteroscopy D&C. Will proceed if EMB is normal. All questions answered.      2. Pregnancy test negative        LAB: Pregnancy Test, Urine     Pregnancy Test, Urine negative               Allman,Michelle 07/30/2012 11:32:47 AM > Dr. Seth Bake infomred.        Procedures  Endometrial Biopsy:          Consent UTP negative, Informed consent obtained.          Prep cervix was prepped with betadine.          Procedure Cervix was grasped with single tooth tenaculum, uterus was sounded to 7-8 cm, a 4 mm pipet was advanced without difficulty, with good return of tissue.          Post procedure Patient tolerated procedure well.          Indication DUB, endometrial mass.  Procedure Codes  08657 URINE PREGNANCY TEST      Follow Up  2 Weeks post op

## 2012-08-16 NOTE — Interval H&P Note (Signed)
History and Physical Interval Note:  08/16/2012 3:05 PM  Danielle Huerta  has presented today for surgery, with the diagnosis of Dysfunctional Uterine Bleeding  The various methods of treatment have been discussed with the patient and family. After consideration of risks, benefits and other options for treatment, the patient has consented to Hysteroscopy/D&C/ Trueclear-Endometrial mass, DUB as a surgical intervention .  The patient's history has been reviewed, patient examined, no change in status, stable for surgery. CV:  RRR, Lungs: CTA Bilaterally, abdomen soft, nontender. I have reviewed the patient's chart and labs.  Questions were answered to the patient's satisfaction.    Bleeding has stopped.  Taking Necon one tablet daily.  Dion Body, Elta Angell

## 2012-08-16 NOTE — Brief Op Note (Signed)
08/16/2012  4:05 PM  PATIENT:  Danielle Huerta  36 y.o. female  PRE-OPERATIVE DIAGNOSIS:  Menometrorrhagia; Dysfunctional Uterine Bleeding; Endometrial Polyp  POST-OPERATIVE DIAGNOSIS:  Menometrorrhagia; Dysfunctional Uterine Bleeding; Endometrial Polyp  PROCEDURE:  Hysteroscopy/D&C/Endometrial polypectomy and "currettage" with Trueclear.  SURGEON:  Surgeon(s) and Role:    * Geryl Rankins, MD - Primary  PHYSICIAN ASSISTANT: None   ASSISTANTS: none   ANESTHESIA:   General LMA  EBL:  Total I/O In: -  Out: 50 [Urine:50]  BLOOD ADMINISTERED:none  DRAINS: none   LOCAL MEDICATIONS USED:  LIDOCAINE 2% 2 cc  SPECIMEN:  Source of Specimen:  Endometrial polyp and endometrial shavings  DISPOSITION OF SPECIMEN:  PATHOLOGY  COUNTS:  YES  TOURNIQUET:  * No tourniquets in log *  DICTATION: .Other Dictation: Dictation Number [id number]  PLAN OF CARE: Discharge to home after PACU  PATIENT DISPOSITION:  PACU - hemodynamically stable.   Delay start of Pharmacological VTE agent (>24hrs) due to surgical blood loss or risk of bleeding: not applicable

## 2012-08-16 NOTE — Anesthesia Preprocedure Evaluation (Addendum)
Anesthesia Evaluation  Patient identified by MRN, date of birth, ID band Patient awake    Reviewed: Allergy & Precautions, H&P , NPO status , Patient's Chart, lab work & pertinent test results  Airway Mallampati: III TM Distance: >3 FB Neck ROM: Full    Dental No notable dental hx. (+) Teeth Intact   Pulmonary neg pulmonary ROS,  breath sounds clear to auscultation  Pulmonary exam normal       Cardiovascular negative cardio ROS  Rhythm:Regular Rate:Normal     Neuro/Psych negative neurological ROS  negative psych ROS   GI/Hepatic negative GI ROS, Neg liver ROS,   Endo/Other  negative endocrine ROS  Renal/GU negative Renal ROS  negative genitourinary   Musculoskeletal negative musculoskeletal ROS (+)   Abdominal (+) + obese,   Peds  Hematology negative hematology ROS (+)   Anesthesia Other Findings   Reproductive/Obstetrics DUB                          Anesthesia Physical Anesthesia Plan  ASA: II  Anesthesia Plan: General   Post-op Pain Management:    Induction: Intravenous  Airway Management Planned: LMA  Additional Equipment:   Intra-op Plan:   Post-operative Plan: Extubation in OR  Informed Consent: I have reviewed the patients History and Physical, chart, labs and discussed the procedure including the risks, benefits and alternatives for the proposed anesthesia with the patient or authorized representative who has indicated his/her understanding and acceptance.   Dental advisory given  Plan Discussed with: CRNA, Anesthesiologist and Surgeon  Anesthesia Plan Comments:         Anesthesia Quick Evaluation

## 2012-08-17 ENCOUNTER — Encounter (HOSPITAL_COMMUNITY): Payer: Self-pay | Admitting: Obstetrics and Gynecology

## 2012-08-17 NOTE — Op Note (Signed)
Danielle Huerta, Danielle Huerta NO.:  [id number]  MEDICAL RECORD NO.:  [id number]  LOCATION:  [facility]                          FACILITY:  [facility]  PHYSICIAN:  Pieter Partridge, MD   DATE OF BIRTH:  08/24/1976  DATE OF PROCEDURE:  08/16/2012 DATE OF DISCHARGE:  08/16/2012                              OPERATIVE REPORT   PREOPERATIVE DIAGNOSES:  Dysfunctional uterine bleeding and endometrial polyp, menometrorrhagia.  PROCEDURES:  Hysteroscopy, D and C, endometrial polypectomy, and curettage with TRUCLEAR device.  SURGEON:  Pieter Partridge, MD.  ASSISTANT:  None.  ANESTHESIA:  General and LMA.  EBL:  Minimal.  URINE:  50 out prior to procedure.  SPECIMEN:  Endometrial polyp and endometrial shavings local with 2% lidocaine 2 mL.  DISPOSITION:  To PACU hemodynamically stable.  COMPLICATIONS:  None.  FINDINGS:  The patient had an endometrial polyp in the right cornu adjacent to the right ostia.  The left ostia was normal.  Normal uterine cavity.  No septum.  Lots of moderate amount of endometrial tissue.  A few areas appeared to have some darker areas consistent with blood clots.  PROCEDURE IN DETAIL:  Danielle Huerta was identified in the holding area.  She was then taken to the operating room, where she underwent LMA anesthesia without complication.  She was placed in the dorsal lithotomy position and prepped and draped in a normal sterile fashion.  I and O catheterization of the bladder was performed.  Graves speculum was inserted into the vagina and the anterior lip of the cervix was visualized.  The patient did have some relaxation of the vaginal walls.  The anterior lip of the cervix was then injected with 2% lidocaine, and a single-tooth tenaculum was applied.  The cervix was dilated up to a 5 Hegar.  The hysteroscope was then advanced to reveal the findings above.  The TRUCLEAR device was then inserted.  There was a little resistance getting it into the  hysteroscope initially, but throughout the case it slid back and forth normally.  The endometrial polyp was in the corner of the uterus.  The patient was laid down during the procedure due to the angling and the length needed for the hysteroscope to reach that area.  The TRUCLEAR device was advanced to the polyp and it was resected instantly, very soft-appearing mass the remaining portion of the cavity.  Due to the patient's previous history of 2 D and Cs and her desire to get pregnant, I decided to do the sampling of the rest of the endometrium with the TRUCLEAR device shaving the endometrium and so further for the remainder of the case, I cleaned up the uterus and got rid of all of the excessive endometrial tissue noted and at the end of the procedure the cavity appeared to be very clean and normal.  All structures were easily identified.  The patient had most of the tissue in the mid portion of the uterus anterior and posterior.  The hysteroscope was then removed from the uterus.  Single tooth tenaculum was removed from the anterior lip of the cervix.  Hemostasis was achieved with silver nitrate.  Graves speculum was then removed. Deficit  of fluid was 200.  The cervical os was without any active bleeding.  The patient tolerated the procedure well.  She got Flagyl and Cipro prior to procedure.  She had SCDs on and running.  She awakened in the recovery room in stable condition.     Pieter Partridge, MD     EBV/MEDQ  D:  08/16/2012  T:  08/17/2012  Job:  [id number]

## 2013-04-02 ENCOUNTER — Encounter (HOSPITAL_COMMUNITY): Payer: Self-pay | Admitting: *Deleted

## 2013-04-02 ENCOUNTER — Emergency Department (HOSPITAL_COMMUNITY)
Admission: EM | Admit: 2013-04-02 | Discharge: 2013-04-02 | Disposition: A | Discharge status: Home / Self Care | Payer: Self-pay | Attending: Emergency Medicine | Admitting: Emergency Medicine

## 2013-04-02 ENCOUNTER — Emergency Department (HOSPITAL_COMMUNITY): Payer: Self-pay

## 2013-04-02 DIAGNOSIS — S8990XA Unspecified injury of unspecified lower leg, initial encounter: Secondary | ICD-10-CM | POA: Insufficient documentation

## 2013-04-02 DIAGNOSIS — F17200 Nicotine dependence, unspecified, uncomplicated: Secondary | ICD-10-CM | POA: Insufficient documentation

## 2013-04-02 DIAGNOSIS — X58XXXA Exposure to other specified factors, initial encounter: Secondary | ICD-10-CM | POA: Insufficient documentation

## 2013-04-02 DIAGNOSIS — Y9389 Activity, other specified: Secondary | ICD-10-CM | POA: Insufficient documentation

## 2013-04-02 DIAGNOSIS — Y929 Unspecified place or not applicable: Secondary | ICD-10-CM | POA: Insufficient documentation

## 2013-04-02 DIAGNOSIS — M79675 Pain in left toe(s): Secondary | ICD-10-CM

## 2013-04-02 MED ORDER — IBUPROFEN 800 MG PO TABS
800.0000 mg | ORAL_TABLET | Freq: Once | ORAL | Status: AC
  Administered 2013-04-02: 800 mg via ORAL
  Filled 2013-04-02: qty 1

## 2013-04-02 NOTE — ED Notes (Signed)
Pt c/o left 2nd toe pain, reports she is unsure if she injured it. Reports pain started on Sunday. Pt reports pain is worse with weight bearing.

## 2013-04-02 NOTE — ED Notes (Signed)
Pt reports L 2nd toe pain since Sunday, denies any injury at this time.  Pt reports pain is worst with weight bearing.  Swelling noted.  Pt reports pain radiates up to her ankle at times.  Pt is able to move toe but not much.

## 2013-04-02 NOTE — ED Provider Notes (Signed)
History  This chart was scribed for non-physician practitioner working with Geoffery Lyons, MD by Greggory Stallion, ED scribe. This patient was seen in room WTR6/WTR6 and the patient's care was started at 9:20 PM.  CSN: [id number] Arrival date & time 04/02/13  1935   Chief Complaint  Patient presents with  . Toe Injury   The history is provided by the patient. No language interpreter was used.    HPI Comments: Danielle Huerta is a 37 y.o. female who presents to the Emergency Department complaining of sudden onset, constant throbbing 2nd toe pain that started on Sunday. Pain varies from moderate to severe with weight bearing. Pt states she thinks she may have stubbed it, but it wasn't bothering her until a few hours ago. Pt took no interventions.    History reviewed. No pertinent past medical history. Past Surgical History  Procedure Laterality Date  . Therapeutic abortion    . Dilatation & currettage/hysteroscopy with resectocope  08/16/2012    Procedure: DILATATION & CURETTAGE/HYSTEROSCOPY WITH RESECTOCOPE;  Surgeon: Geryl Rankins, MD;  Location: [facility];  Service: Gynecology;  Laterality: N/A;   History reviewed. No pertinent family history. History  Substance Use Topics  . Smoking status: Current Every Day Smoker -- 0.25 packs/day for 15 years    Types: Cigarettes  . Smokeless tobacco: Never Used  . Alcohol Use: Yes     Comment: socially   OB History   Grav Para Term Preterm Abortions TAB SAB Ect Mult Living   3 0 0 0 3 3 0 0 0 0      Review of Systems  Constitutional: Negative for fever and diaphoresis.  HENT: Negative for neck pain and neck stiffness.   Eyes: Negative for visual disturbance.  Respiratory: Negative for apnea, chest tightness and shortness of breath.   Cardiovascular: Negative for chest pain and palpitations.  Gastrointestinal: Negative for nausea, vomiting, diarrhea and constipation.  Genitourinary: Negative for dysuria.  Musculoskeletal: Positive for  arthralgias. Negative for gait problem.       Left 2nd toe  Skin: Positive for color change. Negative for rash.       bruising  Neurological: Negative for dizziness, weakness, light-headedness, numbness and headaches.    Allergies  Amoxicillin  Home Medications  No current outpatient prescriptions on file.  BP 132/75  Pulse 91  Temp(Src) 99.8 F (37.7 C) (Oral)  Resp 18  Ht 5\' 6"  (1.676 m)  SpO2 99%  Physical Exam  Nursing note and vitals reviewed. Constitutional: She is oriented to person, place, and time. She appears well-developed and well-nourished. No distress.  HENT:  Head: Normocephalic and atraumatic.  Eyes: Conjunctivae and EOM are normal.  Neck: Normal range of motion. Neck supple.  No meningeal signs  Cardiovascular: Normal rate, regular rhythm, normal heart sounds and intact distal pulses.  Exam reveals no gallop and no friction rub.   No murmur heard. Pulmonary/Chest: Effort normal and breath sounds normal. No respiratory distress. She has no wheezes. She has no rales. She exhibits no tenderness.  Abdominal: Soft. Bowel sounds are normal. She exhibits no distension. There is no tenderness. There is no rebound and no guarding.  Musculoskeletal: Normal range of motion. She exhibits edema and tenderness.  FROM to upper and lower extremities. Tenderness to palpation at proximal phalanx of 2nd toe on left foot. Mild swelling of dorsum of left foot.    Neurological: She is alert and oriented to person, place, and time. No cranial nerve deficit.  Sensation normal to light touch  and two point discrimination Moves extremities without ataxia, coordination intact Gait altered by slight limp secondary to pain. Normal balance Normal strength in upper and lower extremities bilaterally including dorsiflexion and plantar flexion, strong and equal grip strength   Skin: Skin is warm and dry. She is not diaphoretic. No erythema.  bruising to proximal phalanx of 2nd toe of left  foot.   Psychiatric: She has a normal mood and affect.    ED Course  Procedures (including critical care time)  DIAGNOSTIC STUDIES: Oxygen Saturation is 99% on RA, normal by my interpretation.    COORDINATION OF CARE: 10:17 PM-Discussed treatment plan which includes ibuprofen and post op shoe with pt at bedside and pt agreed to plan.   Labs Reviewed - No data to display Dg Toe 2nd Left  04/02/2013   *RADIOLOGY REPORT*  Clinical Data: Left second toe injury  LEFT SECOND TOE  Comparison: None.  Findings: Three views of the left second toes submitted.  No acute fracture or subluxation.  No radiopaque foreign body.  IMPRESSION: .  No acute fracture or subluxation.   Original Report Authenticated By: Natasha Mead, M.D.   1. Toe pain, left     MDM  Neurovascularly intact. Imaging shows no fracture. Directed pt to ice injury, take acetaminophen or ibuprofen for pain, and to elevate and rest the injury when possible. Buddy taped toe, post op shoe, and crutches. Discussed reasons to seek immediate care. Patient expresses understanding and agrees with plan.  I personally performed the services described in this documentation, which was scribed in my presence. The recorded information has been reviewed and is accurate.  Glade Nurse, PA-C 04/03/13 0040

## 2013-04-03 NOTE — ED Provider Notes (Signed)
Medical screening examination/treatment/procedure(s) were performed by non-physician practitioner and as supervising physician I was immediately available for consultation/collaboration.  Geoffery Lyons, MD 04/03/13 1758

## 2013-08-21 ENCOUNTER — Encounter (HOSPITAL_COMMUNITY): Payer: Self-pay | Admitting: Emergency Medicine

## 2013-08-21 ENCOUNTER — Emergency Department (HOSPITAL_COMMUNITY)
Admission: EM | Admit: 2013-08-21 | Discharge: 2013-08-21 | Disposition: A | Discharge status: Home / Self Care | Payer: BC Managed Care – PPO | Attending: Emergency Medicine | Admitting: Emergency Medicine

## 2013-08-21 DIAGNOSIS — H9209 Otalgia, unspecified ear: Secondary | ICD-10-CM | POA: Insufficient documentation

## 2013-08-21 DIAGNOSIS — Z880 Allergy status to penicillin: Secondary | ICD-10-CM | POA: Insufficient documentation

## 2013-08-21 DIAGNOSIS — F17200 Nicotine dependence, unspecified, uncomplicated: Secondary | ICD-10-CM | POA: Insufficient documentation

## 2013-08-21 DIAGNOSIS — K089 Disorder of teeth and supporting structures, unspecified: Secondary | ICD-10-CM | POA: Insufficient documentation

## 2013-08-21 DIAGNOSIS — K0500 Acute gingivitis, plaque induced: Secondary | ICD-10-CM

## 2013-08-21 DIAGNOSIS — K029 Dental caries, unspecified: Secondary | ICD-10-CM

## 2013-08-21 DIAGNOSIS — K0381 Cracked tooth: Secondary | ICD-10-CM | POA: Insufficient documentation

## 2013-08-21 DIAGNOSIS — K0510 Chronic gingivitis, plaque induced: Secondary | ICD-10-CM | POA: Insufficient documentation

## 2013-08-21 DIAGNOSIS — H9202 Otalgia, left ear: Secondary | ICD-10-CM

## 2013-08-21 MED ORDER — ACETAMINOPHEN 500 MG PO TABS
1000.0000 mg | ORAL_TABLET | Freq: Once | ORAL | Status: AC
  Administered 2013-08-21: 1000 mg via ORAL
  Filled 2013-08-21: qty 2

## 2013-08-21 MED ORDER — TRAMADOL HCL 50 MG PO TABS
50.0000 mg | ORAL_TABLET | Freq: Once | ORAL | Status: AC
  Administered 2013-08-21: 50 mg via ORAL
  Filled 2013-08-21: qty 1

## 2013-08-21 MED ORDER — TRAMADOL HCL 50 MG PO TABS: 50.0000 mg | ORAL_TABLET | Freq: Four times a day (QID) | ORAL | Status: DC | PRN

## 2013-08-21 MED ORDER — CLINDAMYCIN HCL 150 MG PO CAPS: 150.0000 mg | ORAL_CAPSULE | Freq: Four times a day (QID) | ORAL | Status: DC

## 2013-08-21 MED ORDER — ACETAMINOPHEN 500 MG PO TABS: 500.0000 mg | ORAL_TABLET | Freq: Four times a day (QID) | ORAL | Status: DC | PRN

## 2013-08-21 NOTE — ED Notes (Addendum)
Pt in stating she has had a left earache/ toothache x2-3 days, pain radiating into face, denies fever at home

## 2013-08-21 NOTE — ED Provider Notes (Signed)
CSN: [id number]     Arrival date & time 08/21/13  2102 History  This chart was scribed for non-physician practitioner Junius Finner, PA-C working with Hilario Quarry, MD by Danella Maiers, ED Scribe. This patient was seen in room TR08C/TR08C and the patient's care was started at 9:10 PM.   Chief Complaint  Patient presents with  . Otalgia   The history is provided by the patient. No language interpreter was used.   HPI Comments: Danielle Huerta is a 37 y.o. female who presents to the Emergency Department complaining of left ear pain that radiates down into her left cheek for the past 2-3 days. She reports associated left upper molar pain. She reports having a cracked tooth in this area. She has been taking aleve and Anbesol with no relief. She states the severity of the pain is an 8-9/10. She does not have a dentist or a family doctor. She denies fevers, nausea, vomiting, dyspnea, trouble swallowing.   History reviewed. No pertinent past medical history. Past Surgical History  Procedure Laterality Date  . Therapeutic abortion    . Dilatation & currettage/hysteroscopy with resectocope  08/16/2012    Procedure: DILATATION & CURETTAGE/HYSTEROSCOPY WITH RESECTOCOPE;  Surgeon: Geryl Rankins, MD;  Location: [facility];  Service: Gynecology;  Laterality: N/A;   History reviewed. No pertinent family history. History  Substance Use Topics  . Smoking status: Current Every Day Smoker -- 0.25 packs/day for 15 years    Types: Cigarettes  . Smokeless tobacco: Never Used  . Alcohol Use: Yes     Comment: socially   OB History   Grav Para Term Preterm Abortions TAB SAB Ect Mult Living   3 0 0 0 3 3 0 0 0 0      Review of Systems  HENT: Positive for dental problem and ear pain.   All other systems reviewed and are negative.    Allergies  Amoxicillin  Home Medications   Current Outpatient Rx  Name  Route  Sig  Dispense  Refill  . naproxen sodium (ANAPROX) 220 MG tablet   Oral   Take 440 mg by  mouth daily as needed (for pain).         Marland Kitchen acetaminophen (TYLENOL) 500 MG tablet   Oral   Take 1 tablet (500 mg total) by mouth every 6 (six) hours as needed.   30 tablet   0   . clindamycin (CLEOCIN) 150 MG capsule   Oral   Take 1 capsule (150 mg total) by mouth every 6 (six) hours.   28 capsule   0   . traMADol (ULTRAM) 50 MG tablet   Oral   Take 1 tablet (50 mg total) by mouth every 6 (six) hours as needed.   15 tablet   0    BP 164/107  Pulse 94  Temp(Src) 97.3 F (36.3 C) (Oral)  Resp 20  Wt 227 lb (102.967 kg)  SpO2 100% Physical Exam  Nursing note and vitals reviewed. Constitutional: She is oriented to person, place, and time. She appears well-developed and well-nourished.  HENT:  Head: Normocephalic and atraumatic.  Right Ear: Hearing, tympanic membrane, external ear and ear canal normal.  Left Ear: Hearing, tympanic membrane, external ear and ear canal normal.  Nose: Nose normal.  Mouth/Throat: Uvula is midline, oropharynx is clear and moist and mucous membranes are normal. She does not have dentures. No oral lesions. No trismus in the jaw. Abnormal dentition. Dental caries present. No dental abscesses, uvula swelling or lacerations.  Missing tooth in left upper jaw. Inflamed tender gingiva with multiple surrounding dental carries.    Eyes: EOM are normal.  Neck: Normal range of motion.  Cardiovascular: Normal rate.   Pulmonary/Chest: Effort normal.  Musculoskeletal: Normal range of motion.  Neurological: She is alert and oriented to person, place, and time.  Skin: Skin is warm and dry.  Psychiatric: She has a normal mood and affect. Her behavior is normal.    ED Course  Procedures (including critical care time) Medications  traMADol (ULTRAM) tablet 50 mg (50 mg Oral Given 08/21/13 2153)  acetaminophen (TYLENOL) tablet 1,000 mg (1,000 mg Oral Given 08/21/13 2153)    DIAGNOSTIC STUDIES: Oxygen Saturation is 100% on RA, normal by my  interpretation.    COORDINATION OF CARE: 9:42 PM- Discussed treatment plan with pt which includes discharge home with pain medication and antibiotics. Advised to follow up with Dr. Russella Dar, DDS, in 24-48 hours. Pt agrees to plan.    Labs Review Labs Reviewed - No data to display Imaging Review No results found.  EKG Interpretation   None       MDM   1. Acute otalgia, left   2. Pain due to dental caries   3. Gingivitis, acute     I personally performed the services described in this documentation, which was scribed in my presence. The recorded information has been reviewed and is accurate.    Junius Finner, PA-C 08/22/13 0007

## 2013-08-24 ENCOUNTER — Encounter (HOSPITAL_COMMUNITY): Payer: Self-pay | Admitting: Emergency Medicine

## 2013-08-24 DIAGNOSIS — E669 Obesity, unspecified: Secondary | ICD-10-CM | POA: Insufficient documentation

## 2013-08-24 DIAGNOSIS — Z880 Allergy status to penicillin: Secondary | ICD-10-CM | POA: Insufficient documentation

## 2013-08-24 DIAGNOSIS — R51 Headache: Secondary | ICD-10-CM | POA: Insufficient documentation

## 2013-08-24 DIAGNOSIS — K029 Dental caries, unspecified: Secondary | ICD-10-CM | POA: Insufficient documentation

## 2013-08-24 DIAGNOSIS — F17200 Nicotine dependence, unspecified, uncomplicated: Secondary | ICD-10-CM | POA: Insufficient documentation

## 2013-08-24 NOTE — ED Notes (Signed)
Pt. reports left ear ache for 2 days with slight headache .

## 2013-08-25 ENCOUNTER — Emergency Department (HOSPITAL_COMMUNITY)
Admission: EM | Admit: 2013-08-25 | Discharge: 2013-08-25 | Disposition: A | Discharge status: Home / Self Care | Payer: BC Managed Care – PPO | Attending: Emergency Medicine | Admitting: Emergency Medicine

## 2013-08-25 DIAGNOSIS — K029 Dental caries, unspecified: Secondary | ICD-10-CM

## 2013-08-25 HISTORY — DX: Obesity, unspecified: E66.9

## 2013-08-25 MED ORDER — HYDROCODONE-ACETAMINOPHEN 5-325 MG PO TABS
1.0000 | ORAL_TABLET | Freq: Once | ORAL | Status: AC
  Administered 2013-08-25: 1 via ORAL
  Filled 2013-08-25: qty 1

## 2013-08-25 MED ORDER — HYDROCODONE-ACETAMINOPHEN 5-325 MG PO TABS: ORAL_TABLET | ORAL | Status: DC

## 2013-08-25 NOTE — ED Provider Notes (Signed)
History/physical exam/procedure(s) were performed by non-physician practitioner and as supervising physician I was immediately available for consultation/collaboration. I have reviewed all notes and am in agreement with care and plan.   Joi Leyva S Dearl Rudden, MD 08/25/13 1522 

## 2013-08-25 NOTE — Discharge Instructions (Signed)
 Dental Caries  Dental caries (also called tooth decay) is the most common oral disease. It can occur at any age, but is more common in children and young adults.  HOW DENTAL CARIES DEVELOPS  The process of decay begins when bacteria and foods (particularly sugars and starches) combine in your mouth to produce plaque. Plaque is a substance that sticks to the hard, outer surface of a tooth (enamel). The bacteria in plaque produce acids that attack enamel. These acids may also attack the root surface of a tooth (cementum) if it is exposed. Repeated attacks dissolve these surfaces and create holes in the tooth (cavities). If left untreated, the acids destroy the other layers of the tooth.  RISK FACTORS  Frequent sipping of sugary beverages.   Frequent snacking on sugary and starchy foods, especially those that easily get stuck in the teeth.   Poor oral hygiene.   Dry mouth.   Substance abuse such as methamphetamine abuse.   Broken or poor-fitting dental restorations.   Eating disorders.   Gastroesophageal reflux disease (GERD).   Certain radiation treatments to the head and neck. SYMPTOMS In the early stages of dental caries, symptoms are seldom present. Sometimes white, chalky areas may be seen on the enamel or other tooth layers. In later stages, symptoms may include:  Pits and holes on the enamel.  Toothache after sweet, hot, or cold foods or drinks are consumed.  Pain around the tooth.  Swelling around the tooth. DIAGNOSIS  Most of the time, dental caries is detected during a regular dental checkup. A diagnosis is made after a thorough medical and dental history is taken and the surfaces of your teeth are checked for signs of dental caries. Sometimes special instruments, such as lasers, are used to check for dental caries. Dental X-ray exams may be taken so that areas not visible to the eye (such as between the contact areas of the teeth) can be checked for cavities.    TREATMENT  If dental caries is in its early stages, it may be reversed with a fluoride treatment or an application of a remineralizing agent at the dental office. Thorough brushing and flossing at home is needed to aid these treatments. If it is in its later stages, treatment depends on the location and extent of tooth destruction:   If a small area of the tooth has been destroyed, the destroyed area will be removed and cavities will be filled with a material such as gold, silver amalgam, or composite resin.   If a large area of the tooth has been destroyed, the destroyed area will be removed and a cap (crown) will be fitted over the remaining tooth structure.   If the center part of the tooth (pulp) is affected, a procedure called a root canal will be needed before a filling or crown can be placed.   If most of the tooth has been destroyed, the tooth may need to be pulled (extracted). HOME CARE INSTRUCTIONS You can prevent, stop, or reverse dental caries at home by practicing good oral hygiene. Good oral hygiene includes:  Thoroughly cleaning your teeth at least twice a day with a toothbrush and dental floss.   Using a fluoride toothpaste. A fluoride mouth rinse may also be used if recommended by your dentist or health care provider.   Restricting the amount of sugary and starchy foods and sugary liquids you consume.   Avoiding frequent snacking on these foods and sipping of these liquids.   Keeping regular visits  with a dentist for checkups and cleanings. PREVENTION   Practice good oral hygiene.  Consider a dental sealant. A dental sealant is a coating material that is applied by your dentist to the pits and grooves of teeth. The sealant prevents food from being trapped in them. It may protect the teeth for several years.  Ask about fluoride supplements if you live in a community without fluorinated water or with water that has a low fluoride content. Use fluoride supplements  as directed by your dentist or health care provider.  Allow fluoride varnish applications to teeth if directed by your dentist or health care provider. Document Released: 06/04/2002 Document Revised: 05/15/2013 Document Reviewed: 09/14/2012 [facility] Patient Information 2014 [place], [place].     Dental Pain A tooth ache may be caused by cavities (tooth decay). Cavities expose the nerve of the tooth to air and hot or cold temperatures. It may come from an infection or abscess (also called a boil or furuncle) around your tooth. It is also often caused by dental caries (tooth decay). This causes the pain you are having. DIAGNOSIS  Your caregiver can diagnose this problem by exam. TREATMENT   If caused by an infection, it may be treated with medications which kill germs (antibiotics) and pain medications as prescribed by your caregiver. Take medications as directed.  Only take over-the-counter or prescription medicines for pain, discomfort, or fever as directed by your caregiver.  Whether the tooth ache today is caused by infection or dental disease, you should see your dentist as soon as possible for further care. SEEK MEDICAL CARE IF: The exam and treatment you received today has been provided on an emergency basis only. This is not a substitute for complete medical or dental care. If your problem worsens or new problems (symptoms) appear, and you are unable to meet with your dentist, call or return to this location. SEEK IMMEDIATE MEDICAL CARE IF:   You have a fever.  You develop redness and swelling of your face, jaw, or neck.  You are unable to open your mouth.  You have severe pain uncontrolled by pain medicine. MAKE SURE YOU:   Understand these instructions.  Will watch your condition.  Will get help right away if you are not doing well or get worse. Document Released: 09/12/2005 Document Revised: 12/05/2011 Document Reviewed: 04/30/2008 [facility] Patient Information 2014  [place], [place].   RESOURCE GUIDE  Chronic Pain Problems: Contact [facility]  [phone] Patients need to be referred by their primary care doctor.  Insufficient Money for Medicine: Contact United Way:  call [phone]  No Primary Care Doctor: - Call Health Connect  [phone] - can help you locate a primary care doctor that  accepts your insurance, provides certain services, etc. - Physician Referral [phone]  Agencies that provide inexpensive medical care: - [facility]  [phone] - [facility]  [phone] - [facility]  [phone] - Women's Clinic  [phone] - Planned Parenthood  [phone] [facility]  [phone]  Medicaid-accepting [facility] Providers: - [facility]- [address]  [phone], Mon-Fri 9am-7pm, Sat 9am-1pm - [facility]- [address] [place], [address] [place]  [phone] - [facility]- [address] [place]  [phone] [facility]- [address]  [phone] - Kennieth Leech- [address] [place], [address], [phone]  Only accepts [place] patients after they  have their name  applied to their card  Self Pay (no insurance) in [facility]: - Sickle Cell Patients - [facility]  [address] [place], [phone] - [facility]- [address] [place]  [phone]       [facility] [place]- [address]       -     [facility]- see information above (Speak to [employer] if you do not have insurance)       -  [facility]- [address],  [phone]       -  [facility]- [address] [phone]       -  Dr [place]ina-  [address], [place], [phone]       -  [facility] - [address] [phone]       -  [facility]- [address] [phone], also [address], [phone]       -     [facility]- [address] [place], [phone], 1st & 3rd Saturday         every month, 10am-1pm  -     [facility]   [address] [place], [place].   Phone:  [phone], Fax:  [phone]. Hours of Operation:  9 am - 6 pm, M-F.  -     [facility]   [address], [place]   Phone: [phone], Fax: [phone]. Hours of Operation:  8:30 am - 5:30 pm, M-F.  [facility] [address] [place], [place] [address] [phone]  [facility] [address] [place], [place] [address] [phone]  1) Find a Doctor and Pay Out of Pocket Although you won't have to find out who is covered by your insurance plan, it is a good idea to ask around and get recommendations. You will then need to call the office and see if the doctor you have chosen will accept you as a new patient and what types of options they offer for patients who are self-pay. Some doctors offer discounts or will set up payment plans for their patients who do not have insurance, but you will need to ask so you aren't surprised when you get to your appointment.  2) Contact Your Local Health Department Not all health departments have doctors that can see patients for sick visits, but many do, so it is worth a call to see if yours does. If you don't know where your local health department is, you can check in your phone book. The CDC also has a tool to help you locate your state's health department, and many state websites also have listings of all of their local health departments.  3) Find a Walk-in Clinic If your illness is not likely to be very severe or complicated, you may want to try a walk in clinic. These are popping up all over the country in pharmacies, drugstores, and shopping centers. They're usually staffed by nurse practitioners or physician assistants  that have been trained to treat common illnesses and complaints. They're usually fairly quick and inexpensive. However, if you have serious medical issues or chronic medical problems, these are probably not your best option  STD Testing - [facility] Department of [facility] [place], [place]  Clinic, [address] [place], phone [phone] or [phone].  Monday - Friday, call for an appointment. [facility] Department of [employer], STD Clinic, [address], [place], phone [phone] or [phone].  Monday - Friday, call for an appointment.  Abuse/Neglect: [facility] Child Abuse Hotline 8307227054 [facility] Child Abuse Hotline 773-192-8933 (After Hours)  Emergency Shelter:  [facility] (938)684-7497  Maternity Homes: - [facility] at the [place] [facility] [phone] - [facility] [phone]  MRSA Hotline #:   559-601-5107  Dental Assistance If unable to pay or uninsured, contact:  [facility]. to become qualified for the adult dental clinic.  Patients with Medicaid: [facility] [phone] [address] [place], [phone] [address] [phone]  If unable to pay, or uninsured, contact [facility] [phone] in [place], [phone] in [facility]) to become qualified for the adult dental clinic  [facility] [address] [place], [place] [address] [phone] [email or web address]  Other Proofreader Services: - [facility]- [address] [place], [place], [place], [address], [phone], Ext. [phone], 2nd and 4th Thursday of the month at 6:30am.  10 clients each day by appointment, can sometimes see walk-in patients if someone does not show for an appointment. [facility]- [address] [place], [place], [address], [phone] - [facility] [address] [place], [place], [address],  [phone] - [facility]- [phone] [facility]- [phone] [facility] Health [phone]       Behavioral Health Resources in the [facility]  Intensive Outpatient Programs: [facility]      [address] [place], [place] [phone] Both a day and evening program       [facility] Outpatient     [address]        [place], [place] [address] [phone]         ADS: [facility] [address] [place] [phone]  [facility] Mental Health ACCESS LINE: [phone] or [phone] [address] [place], [place] [address] [email or web address]   Substance Abuse Resources: - Alcohol and Drug Services  [phone] - [facility] [phone] - [facility] [place] [phone] [facility] [place] [phone] - Residential & Outpatient Substance Abuse Program  [phone]  Psychological Services: [facility]  [phone] [facility] Services  [phone] - [facility], [phone] [place]. [address], [place], ACCESS LINE: [phone] or [phone], [email or web address]  Mobile Crisis Teams:                                        Therapeutic Alternatives         Mobile Crisis Care Unit [phone]             [facility] [address] [place] [phone]                                         Interventionist [address] [place] [place] [phone]  Self-Help/Support Groups: Mental Health Assoc. of [employer] of support groups (725)376-9827 (call for more info)  Narcotics Anonymous (NA) Caring Services  [address] [place] - 2 meetings at this location  Residential Treatment Programs:  [facility]      [address]        [place]  [place]       [phone]         [facility] [address] [place] [address] [place], [place]  [address] [phone]  [facility] Treatment Facility  [address] [place], [place] [address] [phone] Admissions: 8am-3pm M-F  [facility]     [address]        [place], [place] [address]       [phone]         [facility] [address] Christian[place] COYER [place], [place] [phone]  [facility] [address] [place], [place] [phone]  [facility] - Intensive Outpatient      [address]     [place], [place]       [phone]         [facility] (Addiction Recovery Care [facility]oc.)     [address] [place], [place] [phone] or [phone]  Residential Treatment Services (RTS), Medicaid [address] [place], [place] [phone]  Fellowship [address]                                               [address] [place] [phone]  [facility] Resources: [phone]               General Therapy                                                Mliss Rival, PhD        [address] [place], [place] [address]         [phone]   Insurance  [facility]   [address] [place], [place] [address] [phone]  [facility] [address] [place], [place] [address] [phone] Insurance/Medicaid/sponsorship through [facility]                                              [address]                                        [place], [place] [address]    Therapy/tele-psych/case         [phone]          [facility] [address], [place]  [address]  Adolescent/group home/case management [phone]  Recardo Rival PhD       General therapy       Insurance   [phone]         Dr. Curry, Insurance, M-F [id number]  [facility] [place]  [employer] [facility] Dept. [address]                 [address]         [address] [place] [address]  Tinnie Keenan Keenan Phone:  [phone]                                  Phone:  [phone]                   Phone:  [phone]  [facility], [phone] - [facility] - [facility]- [phone]       -     [facility] in [place], [address]             [phone], [facility] Child Abuse Hotline (402)219-0044 or 757-761-2351 (After Hours)    Narcotic and benzodiazepine use may cause drowsiness, slowed breathing or dependence.  Please use with caution and do not drive, operate machinery or watch young children alone while taking them.  Taking combinations of these medications or drinking alcohol will potentiate these effects.

## 2013-08-25 NOTE — ED Provider Notes (Signed)
CSN: [id number]     Arrival date & time 08/24/13  2245 History   First MD Initiated Contact with Patient 08/25/13 0021     Chief Complaint  Patient presents with  . Otalgia   (Consider location/radiation/quality/duration/timing/severity/associated sxs/prior Treatment) HPI Comments: Patient reports about 2 days ago she bit into an apple and her left upper pre-molar broke off. She was seen for left-sided facial pain and ear pain or couple of days ago, given a prescription for clindamycin and tramadol which she has been taking. She reports now that she has a headache on the left side of her temple as well and that her medications are not improving it. She admits she was referred to a dentist and she called the other day but their office was closed. She reports that her pain medicine does not seem to be effective. She did try some Naprosyn once without significant relief. She denies any blurred vision, focal numbness or weakness, abnormal taste, vision. She denies any facial droop, neck pain. She denies fever or chills. No hearing loss. No drainage from her ear.  Patient is a 37 y.o. female presenting with ear pain. The history is provided by the patient and a friend.  Otalgia Associated symptoms: headaches   Associated symptoms: no fever and no neck pain     Past Medical History  Diagnosis Date  . Obesity    Past Surgical History  Procedure Laterality Date  . Therapeutic abortion    . Dilatation & currettage/hysteroscopy with resectocope  08/16/2012    Procedure: DILATATION & CURETTAGE/HYSTEROSCOPY WITH RESECTOCOPE;  Surgeon: Geryl Rankins, MD;  Location: [facility];  Service: Gynecology;  Laterality: N/A;   No family history on file. History  Substance Use Topics  . Smoking status: Current Every Day Smoker -- 0.25 packs/day for 15 years    Types: Cigarettes  . Smokeless tobacco: Never Used  . Alcohol Use: Yes     Comment: socially   OB History   Grav Para Term Preterm Abortions TAB  SAB Ect Mult Living   3 0 0 0 3 3 0 0 0 0      Review of Systems  Constitutional: Negative for fever and chills.  HENT: Positive for dental problem and ear pain.   Musculoskeletal: Negative for neck pain.  Neurological: Positive for headaches. Negative for dizziness, speech difficulty, weakness and numbness.  Hematological: Negative for adenopathy.    Allergies  Amoxicillin  Home Medications   Current Outpatient Rx  Name  Route  Sig  Dispense  Refill  . acetaminophen (TYLENOL) 500 MG tablet   Oral   Take 1 tablet (500 mg total) by mouth every 6 (six) hours as needed.   30 tablet   0   . clindamycin (CLEOCIN) 150 MG capsule   Oral   Take 1 capsule (150 mg total) by mouth every 6 (six) hours.   28 capsule   0   . naproxen sodium (ANAPROX) 220 MG tablet   Oral   Take 440 mg by mouth daily as needed (for pain).         . traMADol (ULTRAM) 50 MG tablet   Oral   Take 1 tablet (50 mg total) by mouth every 6 (six) hours as needed.   15 tablet   0   . HYDROcodone-acetaminophen (NORCO/VICODIN) 5-325 MG per tablet      1-2 tablets po q 6 hours prn moderate to severe pain   15 tablet   0    BP 130/81  Pulse 78  Temp(Src) 98 F (36.7 C) (Oral)  Resp 18  SpO2 99% Physical Exam  Nursing note and vitals reviewed. Constitutional: She appears well-developed and well-nourished.  HENT:  Head: Normocephalic and atraumatic.  Left Ear: Hearing, tympanic membrane, external ear and ear canal normal.  Mouth/Throat:      ED Course  Procedures (including critical care time) Labs Review Labs Reviewed - No data to display Imaging Review No results found.  EKG Interpretation   None       MDM   1. Dental caries      I suspect ear pain adn HA are related to dental pain.  Pt is again referred to dentist.  Will change Rx for pain.  I suspect however that her tooth that is clearly decayed did not simply break off a couple of days ago, based on appearance.       Gavin Pound. Oletta Lamas, MD 08/25/13 2302

## 2013-08-25 NOTE — ED Notes (Signed)
Patient has ride home with friend 

## 2014-07-28 ENCOUNTER — Encounter (HOSPITAL_COMMUNITY): Payer: Self-pay | Admitting: Emergency Medicine

## 2014-11-10 ENCOUNTER — Encounter (HOSPITAL_COMMUNITY): Payer: Self-pay | Admitting: Emergency Medicine

## 2014-11-10 ENCOUNTER — Emergency Department (HOSPITAL_COMMUNITY)
Admission: EM | Admit: 2014-11-10 | Discharge: 2014-11-10 | Disposition: A | Discharge status: Home / Self Care | Payer: BLUE CROSS/BLUE SHIELD | Attending: Emergency Medicine | Admitting: Emergency Medicine

## 2014-11-10 DIAGNOSIS — Z720 Tobacco use: Secondary | ICD-10-CM | POA: Diagnosis not present

## 2014-11-10 DIAGNOSIS — H6691 Otitis media, unspecified, right ear: Secondary | ICD-10-CM | POA: Insufficient documentation

## 2014-11-10 DIAGNOSIS — R1110 Vomiting, unspecified: Secondary | ICD-10-CM | POA: Diagnosis not present

## 2014-11-10 DIAGNOSIS — Z880 Allergy status to penicillin: Secondary | ICD-10-CM | POA: Diagnosis not present

## 2014-11-10 DIAGNOSIS — K029 Dental caries, unspecified: Secondary | ICD-10-CM

## 2014-11-10 DIAGNOSIS — E669 Obesity, unspecified: Secondary | ICD-10-CM | POA: Diagnosis not present

## 2014-11-10 DIAGNOSIS — K088 Other specified disorders of teeth and supporting structures: Secondary | ICD-10-CM | POA: Diagnosis present

## 2014-11-10 MED ORDER — CLINDAMYCIN HCL 150 MG PO CAPS: 300.0000 mg | ORAL_CAPSULE | Freq: Three times a day (TID) | ORAL | Status: DC

## 2014-11-10 MED ORDER — HYDROCODONE-ACETAMINOPHEN 5-325 MG PO TABS
1.0000 | ORAL_TABLET | Freq: Once | ORAL | Status: AC
  Administered 2014-11-10: 1 via ORAL
  Filled 2014-11-10: qty 1

## 2014-11-10 MED ORDER — HYDROCODONE-ACETAMINOPHEN 5-325 MG PO TABS: 1.0000 | ORAL_TABLET | Freq: Four times a day (QID) | ORAL | Status: DC | PRN

## 2014-11-10 NOTE — Discharge Instructions (Signed)
Dental Caries °Dental caries (also called tooth decay) is the most common oral disease. It can occur at any age but is more common in children and young adults.  °HOW DENTAL CARIES DEVELOPS  °The process of decay begins when bacteria and foods (particularly sugars and starches) combine in your mouth to produce plaque. Plaque is a substance that sticks to the hard, outer surface of a tooth (enamel). The bacteria in plaque produce acids that attack enamel. These acids may also attack the root surface of a tooth (cementum) if it is exposed. Repeated attacks dissolve these surfaces and create holes in the tooth (cavities). If left untreated, the acids destroy the other layers of the tooth.  °RISK FACTORS °· Frequent sipping of sugary beverages.   °· Frequent snacking on sugary and starchy foods, especially those that easily get stuck in the teeth.   °· Poor oral hygiene.   °· Dry mouth.   °· Substance abuse such as methamphetamine abuse.   °· Broken or poor-fitting dental restorations.   °· Eating disorders.   °· Gastroesophageal reflux disease (GERD).   °· Certain radiation treatments to the head and neck. °SYMPTOMS °In the early stages of dental caries, symptoms are seldom present. Sometimes white, chalky areas may be seen on the enamel or other tooth layers. In later stages, symptoms may include: °· Pits and holes on the enamel. °· Toothache after sweet, hot, or cold foods or drinks are consumed. °· Pain around the tooth. °· Swelling around the tooth. °DIAGNOSIS  °Most of the time, dental caries is detected during a regular dental checkup. A diagnosis is made after a thorough medical and dental history is taken and the surfaces of your teeth are checked for signs of dental caries. Sometimes special instruments, such as lasers, are used to check for dental caries. Dental X-ray exams may be taken so that areas not visible to the eye (such as between the contact areas of the teeth) can be checked for cavities.    °TREATMENT  °If dental caries is in its early stages, it may be reversed with a fluoride treatment or an application of a remineralizing agent at the dental office. Thorough brushing and flossing at home is needed to aid these treatments. If it is in its later stages, treatment depends on the location and extent of tooth destruction:  °· If a small area of the tooth has been destroyed, the destroyed area will be removed and cavities will be filled with a material such as gold, silver amalgam, or composite resin.   °· If a large area of the tooth has been destroyed, the destroyed area will be removed and a cap (crown) will be fitted over the remaining tooth structure.   °· If the center part of the tooth (pulp) is affected, a procedure called a root canal will be needed before a filling or crown can be placed.   °· If most of the tooth has been destroyed, the tooth may need to be pulled (extracted). °HOME CARE INSTRUCTIONS °You can prevent, stop, or reverse dental caries at home by practicing good oral hygiene. Good oral hygiene includes: °· Thoroughly cleaning your teeth at least twice a day with a toothbrush and dental floss.   °· Using a fluoride toothpaste. A fluoride mouth rinse may also be used if recommended by your dentist or health care provider.   °· Restricting the amount of sugary and starchy foods and sugary liquids you consume.   °· Avoiding frequent snacking on these foods and sipping of these liquids.   °· Keeping regular visits with   a dentist for checkups and cleanings. °PREVENTION  °· Practice good oral hygiene. °· Consider a dental sealant. A dental sealant is a coating material that is applied by your dentist to the pits and grooves of teeth. The sealant prevents food from being trapped in them. It may protect the teeth for several years. °· Ask about fluoride supplements if you live in a community without fluorinated water or with water that has a low fluoride content. Use fluoride supplements  as directed by your dentist or health care provider. °· Allow fluoride varnish applications to teeth if directed by your dentist or health care provider. °Document Released: 06/04/2002 Document Revised: 01/27/2014 Document Reviewed: 09/14/2012 °ExitCare® Patient Information ©2015 [employer]. This information is not intended to replace advice given to you by your health care provider. Make sure you discuss any questions you have with your health care provider. ° °Emergency Department Resource Guide °1) Find a Doctor and Pay Out of Pocket °Although you won't have to find out who is covered by your insurance plan, it is a good idea to ask around and get recommendations. You will then need to call the office and see if the doctor you have chosen will accept you as a new patient and what types of options they offer for patients who are self-pay. Some doctors offer discounts or will set up payment plans for their patients who do not have insurance, but you will need to ask so you aren't surprised when you get to your appointment. ° °2) Contact Your Local Health Department °Not all health departments have doctors that can see patients for sick visits, but many do, so it is worth a call to see if yours does. If you don't know where your local health department is, you can check in your phone book. The CDC also has a tool to help you locate your state's health department, and many state websites also have listings of all of their local health departments. ° °3) Find a Walk-in Clinic °If your illness is not likely to be very severe or complicated, you may want to try a walk in clinic. These are popping up all over the country in pharmacies, drugstores, and shopping centers. They're usually staffed by nurse practitioners or physician assistants that have been trained to treat common illnesses and complaints. They're usually fairly quick and inexpensive. However, if you have serious medical issues or chronic medical  problems, these are probably not your best option. ° °No Primary Care Doctor: °- Call [facility] at  832-8000 - they can help you locate a primary care doctor that  accepts your insurance, provides certain services, etc. °- Physician Referral Service- 1-800-533-3463 ° °Chronic Pain Problems: °Organization         Address  Phone   Notes  °[facility]  (336) 297-2271 Patients need to be referred by their primary care doctor.  ° °Medication Assistance: °Organization         Address  Phone   Notes  °[place] [facility] [address] °[place], [place] [address] (336) 641-8030 --Must be a resident of [place] °-- Must have NO insurance coverage whatsoever (no Medicaid/ Medicare, etc.) °-- The pt. MUST have a primary care doctor that directs their care regularly and follows them in the community °  °[facility]  (866) 331-1348   °[employer]  (888) 892-1162   ° °Agencies that provide inexpensive medical care: °Organization           Address  Phone   Notes  °[facility]  (336) 832-8035   °[facility]    (336) 832-7272   °[facility] [address] °[place], [place] [address] (336) 832-4777   °[facility] [address], °[place] (336) 271-4999   °[facility]    (336) 373-0678   °[facility]    (336) 272-1050   °[facility] ° [address], [place] Phone:  (336) 832-4444, Fax:  (336) 832-4440 Hours of Operation:  9 am - 6 pm, M-F.  Also accepts Medicaid/Medicare and self-pay.  °[facility] ° [address], [place] Phone: (336) 832-3150, Fax: (336) 832-3151. Hours of Operation:  8:30 am - 5:30 pm, M-F.  Also accepts Medicaid and self-pay.  °[facility] [place] [address], [place] Phone: (336) 878-6027   °[facility] [address], [place], [place] (336)723-1848, Ext. 123  Mondays & Thursdays: 7-9 AM.  First 15 patients are seen on a first come, first serve basis. °  ° °Medicaid-accepting [place] Providers: ° °Organization         Address  Phone   Notes  °[facility] [address], [place] (336) 641-2100 Also accepts self-pay patients.  °[facility] [address], [place] ° (336) 856-9996   °[facility] [address], [place] (336) 288-8857   °[facility] [address] [place] [address], [place] (336) 299-7000   °Veita Bland [address], [place]  ° (336) 373-1557 Only accepts [place] Access Medicaid patients after they have their name applied to their card.  ° °Self-Pay (no insurance) in [place]: ° °Organization         Address  Phone   Notes  °Sickle Cell Patients, [facility] [address], [place] (336) 832-1970   °[facility] [address], [place] (336) 832-4400   °[facility] [place] [facility] ° [address], [place] (336) 992-4800   °[facility]/Dr. Osei-Bonsu ° [address] [place] [address], [place] or [address], [place] (336) 841-8500 Phone number for both [place] and [place] locations is the same.  °[facility] [address], [place] (336) 299-0000   °[facility] [address] [place] [address], [place] or [address] (336) 852-7530 °(336) 878-2260   °[facility] [address], [place] (336) 350-1642, phone; (336) 294-5005, fax Sees patients 1st and 3rd Saturday of every month.  Must not qualify for public or private insurance (i.e. Medicaid, Medicare, [place] Health Choice, Veterans' Benefits) • Household income should be no more than 200% of the poverty level •The clinic cannot treat you if you are pregnant or think you are pregnant • Sexually transmitted diseases are not treated at  the clinic.  ° ° °Dental Care: °Organization         Address  Phone  Notes  °[facility] [address], [place] (336) 641-6152 Accepts children up to age 21 who are enrolled in Medicaid or [place] Health Choice; pregnant women with a Medicaid card; and children who have applied for Medicaid or [place] Health Choice, but were declined, whose parents can pay a reduced fee at time   of service.  °[facility] [place]  [address], [place] (336) 641-7733 Accepts children up to age 21 who are enrolled in Medicaid or [place] Health Choice; pregnant women with a Medicaid card; and children who have applied for Medicaid or [place] Health Choice, but were declined, whose parents can pay a reduced fee at time of service.  °[facility] ° [address], [place] (336) 641-4533 Patients are seen by appointment only. Walk-ins are not accepted. [facility] will see patients 18 years of age and older. °Monday - Tuesday (8am-5pm) °Most Wednesdays (8:30-5pm) °$30 per visit, cash only  °[facility] ° [address], [place] (336) 641-4533 Patients are seen by appointment only. Walk-ins are not accepted. [facility] will see patients 18 years of age and older. °One Wednesday Evening (Monthly: Volunteer Based).  $30 per visit, cash only  °[facility]  (919) 537-3737 for adults; Children under age 4, call Graduate Pediatric Dentistry at ([phone]) [phone]. Children aged 4-14, please call (919) [phone] to request a pediatric application. ° Dental services are provided in all areas of dental care including fillings, crowns and bridges, complete and partial dentures, implants, gum treatment, root canals, and extractions. Preventive care is also provided. Treatment is provided to both adults and children. °Patients are selected via a lottery and there is often a  waiting list. °  °[facility] [address], °[place] ° (336) 763-8833 [email or web address] °  °[facility] [address], [place], [place] (336)723-1848, Ext. 123 Second and Fourth Thursday of each month, opens at 6:30 AM; Clinic ends at 9 AM.  Patients are seen on a first-come first-served basis, and a limited number are seen during each clinic.  ° °[facility] ° [address], [place], [place] (336) 723-7904   Eligibility Requirements °You must have lived in [place], [place], or [place] counties for at least the last three months. °  You cannot be eligible for state or federal sponsored healthcare insurance, including Veterans Administration, Medicaid, or Medicare. °  You generally cannot be eligible for healthcare insurance through your employer.  °  How to apply: °Eligibility screenings are held every Tuesday and Wednesday afternoon from 1:00 pm until 4:00 pm. You do not need an appointment for the interview!  °[facility] [address], [place], [place] 336-631-2330   °[facility]  336-342-8273   °[facility]  336-703-3100   °[facility]  336-570-6415   ° °Behavioral Health Resources in the Community: °Intensive Outpatient Programs °Organization         Address  Phone  Notes  °[facility] [address], [place], [place] 336-878-6098   °[facility] [address], [place], [place] 336-832-9800   °[facility] [address], [place], [place] ° 336-882-2125   °[facility] [address],  °[place], [place] 1-800-853-5163 or 336-641-4981   °Substance Abuse Resources °Organization         Address  Phone  Notes  °[facility]  336-882-2125   °[facility]  336-784-9470   °[facility]  336-285-9073   °[facility]  336-845-3988   °Residential & Outpatient Substance Abuse  Program  1-800-659-3381   °Psychological Services °Organization         Address    Phone  Notes  °[facility]  336- 832-9600   °[facility]  336- 378-7881   °[facility] [address], [place] 1-800-853-5163 or 336-641-4981   ° °Mobile Crisis Teams °Organization         Address  Phone  Notes  °[facility], Mobile Crisis Care Unit  1-877-626-1772   °Assertive °[facility] ° [address] [place], [place] 336-834-9664   °Sharon DeEsch [address] °[place] 336-554-5454   ° °Self-Help/Support Groups °Organization         Address  Phone             Notes  °[facility] - variety of support groups  336- 373-1402 Call for more information  °Narcotics Anonymous (NA), [facility] [address], °[place]  2 meetings at this location  ° °Residential Treatment Programs °Organization         Address  Phone  Notes  °[facility] [address],    °[place]  1-866-801-8205   °[facility] ° [address], [place], [place] 704-293-8524   °[facility] [address], [place] 336-845-3988 Admissions: 8am-3pm M-F  °[facility] [address]    °[place], [place] 336-841-1104   °[facility] [address], [place], [place] 336-379-7146   °[facility] [address]  °[place], [place] 336-285-9073   °[facility] - Intensive Outpatient [address], [place], [place] 336-852-3033   °[facility] [address]  °[place], [place] 1-877-615-2722 or 336-784-9470   °[facility] [address] [place], [place] 336-227-7417 Accepts Medicaid  °[facility] [address]  °[place] 1-800-659-3381 Substance Abuse/Addiction Treatment  ° °[place] Behavioral Health Resources °Organization          Address  Phone  Notes  °[facility]  (888) 581-9988   °Julie Brannon, PhD [address] [place], [place]   (336) 349-5553 or (336) 951-0000   °[facility]   [address] °[place], [place] (336) 349-4454   °[facility] [address], [place], [place] (336) 342-8316 Insurance/Medicaid/sponsorship through [facility]  °[facility] [address]                                    [place], [place] (336) 342-8316 Therapy/tele-psych/case  °[facility] [address]  ° [place], [place] (336) 349-2233    °Dr. Arfeen  (336) 349-4544   °[facility] [place]  [employer] [place] [facility] 1) [address], [place] °2) [address], [place] °3)  [address], [place] (336) 349-3220 °(336) 342-7768 ° °(336) 342-8140   °[place] [facility] (336) 342-1394 or (336) 342-3537 (After Hours)    ° ° ° °

## 2014-11-10 NOTE — ED Notes (Signed)
Patient with two day history of tooth pain on right lower jaw.  She also has right ear pain and headache on right side of head.  Patient is CAOx3.

## 2014-11-10 NOTE — ED Provider Notes (Signed)
CSN: [id number]     Arrival date & time 11/10/14  0159 History  This chart was scribed for Tilden Fossa, MD by Abel Presto, ED Scribe. This patient was seen in room B15C/B15C and the patient's care was started at 2:17 AM.    Chief Complaint  Patient presents with  . Otitis Media  . Dental Pain  . Headache     Patient is a 39 y.o. female presenting with tooth pain and headaches. The history is provided by the patient. No language interpreter was used.  Dental Pain Associated symptoms: headaches   Associated symptoms: no fever   Headache Associated symptoms: ear pain and vomiting   Associated symptoms: no fever    HPI Comments: Danielle Huerta is a 39 y.o. female who presents to the Emergency Department complaining of right sided ear pain and dental pain with onset 2 days ago. Pt notes associated right-sided headache and some vomiting yesterday. Pt is allergic to amoxicillin. Pt is a smoker and notes EtOH use.  Pt denies chances of pregnancy, drug use, fever, dyspnea, and trouble swallowing.   Past Medical History  Diagnosis Date  . Obesity    Past Surgical History  Procedure Laterality Date  . Therapeutic abortion    . Dilatation & currettage/hysteroscopy with resectocope  08/16/2012    Procedure: DILATATION & CURETTAGE/HYSTEROSCOPY WITH RESECTOCOPE;  Surgeon: Geryl Rankins, MD;  Location: [facility];  Service: Gynecology;  Laterality: N/A;   History reviewed. No pertinent family history. History  Substance Use Topics  . Smoking status: Current Every Day Smoker -- 0.25 packs/day for 15 years    Types: Cigarettes  . Smokeless tobacco: Never Used  . Alcohol Use: Yes     Comment: socially   OB History    Gravida Para Term Preterm AB TAB SAB Ectopic Multiple Living       Review of Systems  Constitutional: Negative for fever.  HENT: Positive for dental problem and ear pain. Negative for trouble swallowing.   Respiratory: Negative for shortness of breath.    Gastrointestinal: Positive for vomiting.  Neurological: Positive for headaches.  All other systems reviewed and are negative.     Allergies  Amoxicillin  Home Medications   Prior to Admission medications   Medication Sig Start Date End Date Taking? Authorizing Provider  acetaminophen (TYLENOL) 500 MG tablet Take 1 tablet (500 mg total) by mouth every 6 (six) hours as needed. 08/21/13   Junius Finner, PA-C  clindamycin (CLEOCIN) 150 MG capsule Take 1 capsule (150 mg total) by mouth every 6 (six) hours. 08/21/13   Junius Finner, PA-C  HYDROcodone-acetaminophen (NORCO/VICODIN) 5-325 MG per tablet 1-2 tablets po q 6 hours prn moderate to severe pain 08/25/13   Gavin Pound. Ghim, MD  naproxen sodium (ANAPROX) 220 MG tablet Take 440 mg by mouth daily as needed (for pain).    Historical Provider, MD  traMADol (ULTRAM) 50 MG tablet Take 1 tablet (50 mg total) by mouth every 6 (six) hours as needed. 08/21/13   Junius Finner, PA-C   BP 140/85 mmHg  Pulse 78  Temp(Src) 98 F (36.7 C) (Oral)  Resp 22  Ht  (1.676 m)  Wt 240 lb (108.863 kg)  BMI 38.76 kg/m2  SpO2 99% Physical Exam  Constitutional: She is oriented to person, place, and time. She appears well-developed and well-nourished.  HENT:  Head: Normocephalic and atraumatic.  Right Ear: Tympanic membrane normal.  Left Ear: Tympanic membrane  normal.  Mouth/Throat: Dental caries present.    Cardiovascular: Normal rate and regular rhythm.   No murmur heard. Pulmonary/Chest: Effort normal and breath sounds normal. No respiratory distress.  Abdominal: Soft. There is no tenderness. There is no rebound and no guarding.  Musculoskeletal: She exhibits no edema or tenderness.  Neurological: She is alert and oriented to person, place, and time.  Skin: Skin is warm and dry.  Psychiatric: She has a normal mood and affect. Her behavior is normal.  Nursing note and vitals reviewed.   ED Course  Procedures (including critical care  time) DIAGNOSTIC STUDIES: Oxygen Saturation is 99% on room air, normal by my interpretation.    COORDINATION OF CARE: 2:25 AM Discussed treatment plan with patient at beside, the patient agrees with the plan and has no further questions at this time.   Labs Review Labs Reviewed - No data to display  Imaging Review No results found.   EKG Interpretation None      MDM   Final diagnoses:  Pain due to dental caries   Patient here for evaluation of dentalgia and otalgia. Exam consistent with dental caries and early dental abscess. Patient is nontoxic appearing with no evidence of Ludwigs Angina. There is no otitis media. No swelling of the posterior oropharynx. Discussed with patient home care for dental abscess with dentistry or oral surgery follow-up and antibiotics. Return precautions were discussed. Prescribing clindamycin this patient is penicillin allergic.  I personally performed the services described in this documentation, which was scribed in my presence. The recorded information has been reviewed and is accurate.     Tilden FossaElizabeth Jamea Robicheaux, MD 11/10/14 [phone]

## 2014-11-10 NOTE — ED Notes (Signed)
Pt a/o x 4 on d/c with steady gait with boyfriend.

## 2015-12-14 ENCOUNTER — Ambulatory Visit: Payer: Self-pay | Admitting: Family Medicine

## 2016-02-03 LAB — OB RESULTS CONSOLE ABO/RH: RH Type: POSITIVE

## 2016-02-03 LAB — OB RESULTS CONSOLE ANTIBODY SCREEN: Antibody Screen: NEGATIVE

## 2016-02-03 LAB — OB RESULTS CONSOLE HEPATITIS B SURFACE ANTIGEN: HEP B S AG: NEGATIVE

## 2016-02-03 LAB — OB RESULTS CONSOLE RUBELLA ANTIBODY, IGM: Rubella: NON-IMMUNE/NOT IMMUNE

## 2016-02-03 LAB — OB RESULTS CONSOLE GC/CHLAMYDIA
CHLAMYDIA, DNA PROBE: NEGATIVE
Gonorrhea: NEGATIVE

## 2016-02-03 LAB — OB RESULTS CONSOLE RPR: RPR: NONREACTIVE

## 2016-02-03 LAB — OB RESULTS CONSOLE HIV ANTIBODY (ROUTINE TESTING): HIV: NONREACTIVE

## 2016-03-31 ENCOUNTER — Encounter (HOSPITAL_COMMUNITY): Payer: Self-pay

## 2016-04-01 ENCOUNTER — Other Ambulatory Visit (HOSPITAL_COMMUNITY): Payer: Self-pay | Admitting: Obstetrics & Gynecology

## 2016-04-01 ENCOUNTER — Encounter (HOSPITAL_COMMUNITY): Payer: Self-pay

## 2016-04-01 ENCOUNTER — Other Ambulatory Visit: Payer: Self-pay | Admitting: Obstetrics & Gynecology

## 2016-04-01 ENCOUNTER — Ambulatory Visit (HOSPITAL_COMMUNITY)
Admission: RE | Admit: 2016-04-01 | Discharge: 2016-04-01 | Disposition: A | Discharge status: Home / Self Care | Payer: BLUE CROSS/BLUE SHIELD | Source: Ambulatory Visit | Attending: Obstetrics & Gynecology | Admitting: Obstetrics & Gynecology

## 2016-04-01 ENCOUNTER — Encounter (HOSPITAL_COMMUNITY): Payer: Self-pay | Admitting: *Deleted

## 2016-04-01 DIAGNOSIS — R9389 Abnormal findings on diagnostic imaging of other specified body structures: Secondary | ICD-10-CM

## 2016-04-01 DIAGNOSIS — Z3A19 19 weeks gestation of pregnancy: Secondary | ICD-10-CM | POA: Diagnosis not present

## 2016-04-01 DIAGNOSIS — O09522 Supervision of elderly multigravida, second trimester: Secondary | ICD-10-CM

## 2016-04-01 DIAGNOSIS — Z36 Encounter for antenatal screening of mother: Secondary | ICD-10-CM | POA: Insufficient documentation

## 2016-04-01 DIAGNOSIS — O26872 Cervical shortening, second trimester: Secondary | ICD-10-CM | POA: Diagnosis not present

## 2016-04-01 DIAGNOSIS — Z3689 Encounter for other specified antenatal screening: Secondary | ICD-10-CM

## 2016-04-01 HISTORY — DX: Gestational diabetes mellitus in pregnancy, unspecified control: O24.419

## 2016-04-01 MED ORDER — GENTAMICIN SULFATE 40 MG/ML IJ SOLN
INTRAVENOUS | Status: AC
  Administered 2016-04-02: 100 mL via INTRAVENOUS
  Filled 2016-04-01: qty 9.5

## 2016-04-01 NOTE — Consult Note (Signed)
Maternal Fetal Medicine Consultation  Requesting Provider(s): Mills KollerValishali Mody, MD  Reason for consultation: Cervical shortening  HPI: Danielle Huerta is a 40 yo G5P0040, EDD 08/20/2016 who is currently at 19w 6d seen for consultation due to recent diagnosis of cervical shortening.  [name] previously declined aneuploidy screening due to advanced maternal age.  She was also recently diagnosed with gestational diabetes, but has not yet completed diabetic education or glucometer training.  She reports a history of PCOS.  Her past OB history is remarkable for three prior TABs and an early SAB (all < [redacted] weeks gestation).  She is without complaints today  OB History: OB History    Gravida Para Term Preterm AB TAB SAB Ectopic Multiple Living   4 0 0 0 3 3 0 0 0 0       PMH:  Past Medical History  Diagnosis Date  . Obesity   . Gestational diabetes     PSH:  Past Surgical History  Procedure Laterality Date  . Therapeutic abortion    . Dilatation & currettage/hysteroscopy with resectocope  08/16/2012    Procedure: DILATATION & CURETTAGE/HYSTEROSCOPY WITH RESECTOCOPE;  Surgeon: Geryl RankinsEvelyn Varnado, MD;  Location: [facility];  Service: Gynecology;  Laterality: N/A;   Meds:  Current Outpatient Prescriptions on File Prior to Encounter  Medication Sig Dispense Refill  . clindamycin (CLEOCIN) 150 MG capsule Take 2 capsules (300 mg total) by mouth 3 (three) times daily. (Patient not taking: Reported on 04/01/2016) 30 capsule 0  . HYDROcodone-acetaminophen (NORCO/VICODIN) 5-325 MG per tablet Take 1 tablet by mouth every 6 (six) hours as needed for moderate pain or severe pain. (Patient not taking: Reported on 04/01/2016) 6 tablet 0  . Prenatal Vit w/Fe-Methylfol-FA (PNV PO) Take by mouth.    . progesterone 200 MG SUPP Place 200 mg vaginally at bedtime.     No current facility-administered medications on file prior to encounter.   Allergies:  Allergies  Allergen Reactions  . Amoxicillin Hives and Itching     FH: Denies family history of birth defects or hereditary disorders.  Father - diabetes, CAD; Mother - elevated cholesterol  Soc:  Social History   Social History  . Marital Status: Single    Spouse Name: N/A  . Number of Children: N/A  . Years of Education: N/A   Occupational History  . Not on file.   Social History Main Topics  . Smoking status: Former Smoker -- 0.25 packs/day for 15 years    Types: Cigarettes  . Smokeless tobacco: Never Used  . Alcohol Use: Yes     Comment: socially  . Drug Use: No  . Sexual Activity: Yes     Comment: trying to concieve   Other Topics Concern  . Not on file   Social History Narrative   Review of Systems: no vaginal bleeding or cramping/contractions, no LOF, no nausea/vomiting. All other systems reviewed and are negative.  PNL:   PE:  231.4 lbs, 118/73, 83  GEN: well-appearing female ABD: gravid, NT  Ultrasound: Single IUP at 19w 6d Advanced maternal age  - previously declined aneuploidy screening Recently diagnosed gestational diabetes Normal fetal anatomic survey Ultrasound measurements are consistent with LMP Posterior placenta without previa Normal amniotic fluid volume  TVUS - cervical length 1.8 cm with funneling.  Some cervical debris noted   A/P: 1) Single IUP at 19w 6d  2) Advanced maternal age - declined aneuploidy screening  3) Cervical shortening - the patient is currently on vaginal progesterone.  The cervical  length today is 1.8 mm with some cervical debris and cervical funneling.  Given this finding, would offer an ultrasound indicated cerclage.  We briefly reviewed risks and benefits of the proposed procedure to include pain, bleeding, infection, risk of PROM and risk that the procedure may not have the desired affect (may result in preterm / previable delivery despite all efforts).  Questions were answered to the patient's satisfaction who desires to proceed with the procedure.  Feel that this should be  scheduled within the next week - recommend continuing vaginal progesterone and would recommend a follow up cervical length in 1-2 weeks following the procedure.  4) Recent diagnosis of gestational diabetes - recommend serial ultrasounds for growth every 4 weeks - please contact our office if you would prefer that these studies be performed with MFM.   Thank you for the opportunity to be a part of the care of Danielle Huerta. Please contact our office if we can be of further assistance.   I spent approximately 30 minutes with this patient with over 50% of time spent in face-to-face counseling.  Alpha GulaPaul Alfrieda Tarry, MD Maternal Fetal Medicine

## 2016-04-02 ENCOUNTER — Encounter (HOSPITAL_COMMUNITY): Payer: Self-pay

## 2016-04-02 ENCOUNTER — Inpatient Hospital Stay (HOSPITAL_COMMUNITY): Payer: BLUE CROSS/BLUE SHIELD | Admitting: Certified Registered Nurse Anesthetist

## 2016-04-02 ENCOUNTER — Inpatient Hospital Stay (HOSPITAL_COMMUNITY)
Admission: RE | Admit: 2016-04-02 | Discharge: 2016-04-02 | Disposition: A | Discharge status: Home / Self Care | Payer: BLUE CROSS/BLUE SHIELD | Source: Ambulatory Visit | Attending: Obstetrics & Gynecology | Admitting: Obstetrics & Gynecology

## 2016-04-02 ENCOUNTER — Encounter (HOSPITAL_COMMUNITY)
Admission: RE | Disposition: A | Discharge status: Home / Self Care | Payer: Self-pay | Source: Ambulatory Visit | Attending: Obstetrics & Gynecology

## 2016-04-02 DIAGNOSIS — O24419 Gestational diabetes mellitus in pregnancy, unspecified control: Secondary | ICD-10-CM | POA: Insufficient documentation

## 2016-04-02 DIAGNOSIS — E669 Obesity, unspecified: Secondary | ICD-10-CM | POA: Insufficient documentation

## 2016-04-02 DIAGNOSIS — Z87891 Personal history of nicotine dependence: Secondary | ICD-10-CM | POA: Diagnosis not present

## 2016-04-02 DIAGNOSIS — E282 Polycystic ovarian syndrome: Secondary | ICD-10-CM | POA: Diagnosis not present

## 2016-04-02 DIAGNOSIS — O99212 Obesity complicating pregnancy, second trimester: Secondary | ICD-10-CM | POA: Diagnosis not present

## 2016-04-02 DIAGNOSIS — O26872 Cervical shortening, second trimester: Secondary | ICD-10-CM | POA: Insufficient documentation

## 2016-04-02 DIAGNOSIS — Z6838 Body mass index (BMI) 38.0-38.9, adult: Secondary | ICD-10-CM | POA: Diagnosis not present

## 2016-04-02 DIAGNOSIS — Z880 Allergy status to penicillin: Secondary | ICD-10-CM | POA: Insufficient documentation

## 2016-04-02 DIAGNOSIS — Z3A20 20 weeks gestation of pregnancy: Secondary | ICD-10-CM | POA: Diagnosis not present

## 2016-04-02 DIAGNOSIS — O09522 Supervision of elderly multigravida, second trimester: Secondary | ICD-10-CM | POA: Diagnosis not present

## 2016-04-02 HISTORY — PX: CERVICAL CERCLAGE: SHX1329

## 2016-04-02 LAB — CBC
HCT: 34.2 % — ABNORMAL LOW (ref 36.0–46.0)
HEMOGLOBIN: 11.7 g/dL — AB (ref 12.0–15.0)
MCH: 29.3 pg (ref 26.0–34.0)
MCHC: 34.2 g/dL (ref 30.0–36.0)
MCV: 85.7 fL (ref 78.0–100.0)
Platelets: 227 10*3/uL (ref 150–400)
RBC: 3.99 MIL/uL (ref 3.87–5.11)
RDW: 13.4 % (ref 11.5–15.5)
WBC: 6.1 10*3/uL (ref 4.0–10.5)

## 2016-04-02 LAB — URINALYSIS, ROUTINE W REFLEX MICROSCOPIC
Bilirubin Urine: NEGATIVE
GLUCOSE, UA: NEGATIVE mg/dL
HGB URINE DIPSTICK: NEGATIVE
Ketones, ur: NEGATIVE mg/dL
Leukocytes, UA: NEGATIVE
Nitrite: NEGATIVE
PH: 5.5 (ref 5.0–8.0)
Protein, ur: NEGATIVE mg/dL

## 2016-04-02 LAB — TYPE AND SCREEN
ABO/RH(D): A POS
Antibody Screen: NEGATIVE

## 2016-04-02 LAB — GLUCOSE, CAPILLARY
Glucose-Capillary: 95 mg/dL (ref 65–99)
Glucose-Capillary: 81 mg/dL (ref 65–99)

## 2016-04-02 LAB — ABO/RH: ABO/RH(D): A POS

## 2016-04-02 SURGERY — CERCLAGE, CERVIX, VAGINAL APPROACH
Anesthesia: Spinal | Site: Cervix

## 2016-04-02 MED ORDER — BUPIVACAINE IN DEXTROSE 0.75-8.25 % IT SOLN
INTRATHECAL | Status: DC | PRN
Start: 2016-04-02 — End: 2016-04-02
  Administered 2016-04-02: 1.2 mL via INTRATHECAL

## 2016-04-02 MED ORDER — FENTANYL CITRATE (PF) 100 MCG/2ML IJ SOLN: 25.0000 ug | INTRAMUSCULAR | Status: DC | PRN

## 2016-04-02 MED ORDER — LACTATED RINGERS IV SOLN
INTRAVENOUS | Status: DC
  Administered 2016-04-02 (×2): via INTRAVENOUS

## 2016-04-02 MED ORDER — FAMOTIDINE IN NACL 20-0.9 MG/50ML-% IV SOLN
20.0000 mg | Freq: Once | INTRAVENOUS | Status: AC
  Administered 2016-04-02: 20 mg via INTRAVENOUS
  Filled 2016-04-02: qty 50

## 2016-04-02 MED ORDER — LACTATED RINGERS IV BOLUS (SEPSIS): 1000.0000 mL | Freq: Once | INTRAVENOUS | Status: DC

## 2016-04-02 MED ORDER — 0.9 % SODIUM CHLORIDE (POUR BTL) OPTIME
TOPICAL | Status: DC | PRN
  Administered 2016-04-02: 1000 mL

## 2016-04-02 MED ORDER — ACETAMINOPHEN 500 MG PO TABS
500.0000 mg | ORAL_TABLET | Freq: Four times a day (QID) | ORAL | Status: AC | PRN
Start: 2016-04-02 — End: ?

## 2016-04-02 MED ORDER — SOD CITRATE-CITRIC ACID 500-334 MG/5ML PO SOLN
30.0000 mL | Freq: Once | ORAL | Status: AC
  Administered 2016-04-02: 30 mL via ORAL
  Filled 2016-04-02: qty 15

## 2016-04-02 SURGICAL SUPPLY — 18 items
CLOTH BEACON ORANGE TIMEOUT ST (SAFETY) ×2 IMPLANT
COUNTER NEEDLE 1200 MAGNETIC (NEEDLE) ×2 IMPLANT
GLOVE BIO SURGEON STRL SZ7 (GLOVE) ×2 IMPLANT
GLOVE BIOGEL PI IND STRL 7.0 (GLOVE) ×2 IMPLANT
GLOVE BIOGEL PI INDICATOR 7.0 (GLOVE) ×2
GOWN STRL REUS W/TWL LRG LVL3 (GOWN DISPOSABLE) ×4 IMPLANT
NEEDLE MAYO CATGUT SZ4 (NEEDLE) ×2 IMPLANT
NS IRRIG 1000ML POUR BTL (IV SOLUTION) ×2 IMPLANT
PACK VAGINAL MINOR WOMEN LF (CUSTOM PROCEDURE TRAY) ×2 IMPLANT
PAD OB MATERNITY 4.3X12.25 (PERSONAL CARE ITEMS) ×2 IMPLANT
PAD PREP 24X48 CUFFED NSTRL (MISCELLANEOUS) ×2 IMPLANT
SUT MERSILENE 5MM BP 1 12 (SUTURE) IMPLANT
SUT PROLENE 1 CT 1 30 (SUTURE) ×2 IMPLANT
SYR BULB IRRIGATION 50ML (SYRINGE) ×2 IMPLANT
TOWEL OR 17X24 6PK STRL BLUE (TOWEL DISPOSABLE) ×4 IMPLANT
TRAY FOLEY CATH SILVER 14FR (SET/KITS/TRAYS/PACK) ×2 IMPLANT
TUBING NON-CON 1/4 X 20 CONN (TUBING) IMPLANT
YANKAUER SUCT BULB TIP NO VENT (SUCTIONS) IMPLANT

## 2016-04-02 NOTE — H&P (Signed)
Danielle Huerta is an 40 y.o. female.Danielle Huerta is a 40 yo G5P0040, EDD 08/20/2016 she is well dated by early 1st trim sono, at 20 wks today with short cervix and is for urgent cervical cerclage. Her Anatomy sono on 7/6/17noted incidental short CL at 2 cm and she was referred to MFM on 04/01/16 for sono and consult, CL short 19mm and was advised cerclage and continue vaginal Prometrium that was started on 03/31/16.  She denies pressure/ abn discharge/ pain/ bleeding/ leaking per vagina.   She has no prior hx of cervical LEEP / cone but has 3 TABs (<12 wks) and one SAB (<10 wk) hx. Hx of D&C and hysteroscopic polypectomy in 2013 She has declined aneuploidy screening for advanced maternal age.  She had early Glucose screen due to AMA, PCOS, obesity and is diagnosed with GDM last week and will start testing per protocol.  Paps have been normal. G/C and STIs negative. Rh positive.   Patient's last menstrual period was 11/14/2015.    Past Medical History  Diagnosis Date  . Obesity   . Gestational diabetes     Past Surgical History  Procedure Laterality Date  . Therapeutic abortion    . Dilatation & currettage/hysteroscopy with resectocope  08/16/2012    Procedure: DILATATION & CURETTAGE/HYSTEROSCOPY WITH RESECTOCOPE;  Surgeon: Geryl Rankins, MD;  Location: [facility];  Service: Gynecology;  Laterality: N/A;    History reviewed. No pertinent family history.  Social History:  reports that she has quit smoking. Her smoking use included Cigarettes. She has a 3.75 pack-year smoking history. She has never used smokeless tobacco. She reports that she drinks alcohol. She reports that she does not use illicit drugs.  Allergies:  Allergies  Allergen Reactions  . Amoxicillin Hives and Itching    Prescriptions prior to admission  Medication Sig Dispense Refill Last Dose  . Prenatal Vit w/Fe-Methylfol-FA (PNV PO) Take by mouth.   04/01/2016 at Unknown time  . progesterone 200 MG SUPP Place 200 mg  vaginally at bedtime.   03/31/2016 at Unknown time  . clindamycin (CLEOCIN) 150 MG capsule Take 2 capsules (300 mg total) by mouth 3 (three) times daily. (Patient not taking: Reported on 04/01/2016) 30 capsule 0 Not Taking  . HYDROcodone-acetaminophen (NORCO/VICODIN) 5-325 MG per tablet Take 1 tablet by mouth every 6 (six) hours as needed for moderate pain or severe pain. (Patient not taking: Reported on 04/01/2016) 6 tablet 0 Not Taking    ROS neg  Height  (1.651 m), weight 231 lb 4 oz (104.894 kg), last menstrual period 11/14/2015. Physical Exam Physical exam:  A&O x 3, no acute distress. Pleasant HEENT neg, no thyromegaly Lungs CTA bilat CV RRR, S1S2 normal Abdo soft, non tender, non acute Extr no edema/ tenderness Pelvic short cervix, closed, no infection.  FHT  Normal  Toco none   No results found for this or any previous visit (from the past 24 hour(s)).  [place] Mfm Ob Transvaginal  04/01/2016  OBSTETRICAL ULTRASOUND: This exam was performed within a [facility]. The OB US report was generated in the AS system, and faxed to the ordering physician.  This report is available in the [employer]. See the AS Obstetric US report via the Image Link.  [place] Mfm Ob Detail +14 Wk  04/01/2016  OBSTETRICAL ULTRASOUND: This exam was performed within a [facility]. The OB US report was generated in the AS system, and faxed to the ordering physician.  This report is available  in the [employer]. See the AS Obstetric US report via the Image Link.   Assessment/Plan: 40 yo, G5P0040, short cervix 2 cm at [redacted] weeks gestation, here for urgent cervical cerclage.  Risks/complications of surgery reviewed incl infection, bleeding, damage to internal organs including bladder, bowels, ureters, blood vessels, other risks from anesthesia, VTE and delayed complications of any surgery, complications in future surgery reviewed. Reviewed risk of PPROM, infection, and failed  cerclage with preterm loss/ delivery. Pt understands and agreed to proceed   Aris Moman R 04/02/2016, 8:43 AM

## 2016-04-02 NOTE — Anesthesia Preprocedure Evaluation (Signed)
Anesthesia Evaluation  Patient identified by MRN, date of birth, ID band Patient awake    Reviewed: Allergy & Precautions, NPO status , Patient's Chart, lab work & pertinent test results  History of Anesthesia Complications Negative for: history of anesthetic complications  Airway Mallampati: II  TM Distance: >3 FB Neck ROM: Full    Dental  (+) Teeth Intact   Pulmonary neg pulmonary ROS, former smoker,    breath sounds clear to auscultation       Cardiovascular negative cardio ROS   Rhythm:Regular     Neuro/Psych negative neurological ROS  negative psych ROS   GI/Hepatic negative GI ROS, Neg liver ROS,   Endo/Other  diabetes, Gestational  Renal/GU negative Renal ROS     Musculoskeletal   Abdominal   Peds  Hematology negative hematology ROS (+)   Anesthesia Other Findings   Reproductive/Obstetrics (+) Pregnancy                             Anesthesia Physical Anesthesia Plan  ASA: II  Anesthesia Plan: Spinal   Post-op Pain Management:    Induction:   Airway Management Planned: Natural Airway, Nasal Cannula and Simple Face Mask  Additional Equipment: None  Intra-op Plan:   Post-operative Plan:   Informed Consent: I have reviewed the patients History and Physical, chart, labs and discussed the procedure including the risks, benefits and alternatives for the proposed anesthesia with the patient or authorized representative who has indicated his/her understanding and acceptance.   Dental advisory given  Plan Discussed with: CRNA and Surgeon  Anesthesia Plan Comments:         Anesthesia Quick Evaluation

## 2016-04-02 NOTE — Progress Notes (Signed)
Pt ambulating without difficulty and drinking fluids frequently.  Unable to void at present, Dr. Juliene PinaMody made aware, bladder scan attempted with a volume of 550 but unsure about accuracy of scan due to [redacted] week gestation pregnancy.  Okay to discharge patient to home with instructions to return to MAU if she does not void by 5:30.

## 2016-04-02 NOTE — Transfer of Care (Signed)
Immediate Anesthesia Transfer of Care Note  Patient: Danielle Huerta  Procedure(s) Performed: Procedure(s): Emergency McDonald CERVICAL CERCLAGE (N/A)  Patient Location: PACU  Anesthesia Type:Spinal  Level of Consciousness: awake, alert  and oriented  Airway & Oxygen Therapy: Patient Spontanous Breathing  Post-op Assessment: Report given to RN and Post -op Vital signs reviewed and stable  Post vital signs: Reviewed and stable  Last Vitals:  Filed Vitals:   04/02/16 0843  BP: 127/72  Pulse: 76  Temp: 36.6 C  Resp: 18    Last Pain: There were no vitals filed for this visit.       Complications: No apparent anesthesia complications

## 2016-04-02 NOTE — Discharge Instructions (Signed)
Cervical Cerclage, Care After °Refer to this sheet in the next few weeks. These instructions provide you with information on caring for yourself after your procedure. Your health care provider may also give you more specific instructions. Your treatment has been planned according to current medical practices, but problems sometimes occur. Call your health care provider if you have any problems or questions after your procedure. °WHAT TO EXPECT AFTER THE PROCEDURE  °After your procedure, it is typical to have the following: °· Abdominal cramping. °· Vaginal spotting. °HOME CARE INSTRUCTIONS  °· Only take over-the-counter or prescription medicines for pain, discomfort, or fever as directed by your health care provider. °· Avoid physical activities and exercise until your health care provider says it is okay. °· Do not douche or have sexual intercourse until your health care provider tells you it is okay. °· Keep your follow-up surgical and prenatal appointments with your health care provider. °SEEK MEDICAL CARE IF:  °· You have abnormal vaginal discharge. °· You have a rash. °· You become lightheaded or feel faint. °· You have abdominal pain that is not controlled with pain medicine. °SEEK IMMEDIATE MEDICAL CARE IF:  °· You develop vaginal bleeding. °· You are leaking fluid or have a gush of fluid from the vagina. °· You have a fever. °· You faint. °· You have uterine contractions. °· You feel your baby is not moving as much as usual, or you cannot feel your baby move. °· You have chest pain or shortness of breath. °  °This information is not intended to replace advice given to you by your health care provider. Make sure you discuss any questions you have with your health care provider. °  °Document Released: 07/03/2013 Document Revised: 09/17/2013 Document Reviewed: 07/03/2013 °Elsevier Interactive Patient Education ©2016 [employer] ° °

## 2016-04-02 NOTE — Anesthesia Postprocedure Evaluation (Signed)
Anesthesia Post Note  Patient: Danielle Huerta  Procedure(s) Performed: Procedure(s) (LRB): Emergency McDonald CERVICAL CERCLAGE (N/A)  Patient location during evaluation: PACU Anesthesia Type: Spinal Level of consciousness: awake Pain management: pain level controlled Vital Signs Assessment: post-procedure vital signs reviewed and stable Respiratory status: spontaneous breathing Cardiovascular status: stable Postop Assessment: no signs of nausea or vomiting, spinal receding and patient able to bend at knees Anesthetic complications: no     Last Vitals:  Filed Vitals:   04/02/16 1145 04/02/16 1328  BP: 120/78 122/76  Pulse: 79 79  Temp: 36.6 C 36.3 C  Resp: 18 18    Last Pain: There were no vitals filed for this visit. Pain Goal:                 Ronniesha Seibold

## 2016-04-02 NOTE — MAU Note (Signed)
Dr. Juliene PinaMody at bedside to consent patient.

## 2016-04-02 NOTE — Op Note (Signed)
04/02/2016 Pre-operative diagnosis: Short cervix 2 cm, [redacted] weeks gestation  Post-operative diagnosis: Same  Procedure: Cervical Cerclage McDonald' method.  Anesthesia: Spinal   IVF: 1 liter LR EBL 5 cc Urine: 20 cc in foley, clear  Complications: None  Disposition: PACU   Indication: 40 yo G5P0040, with incidental finding of short cervix 2 cm at anatomy sono at 20 weeks. History of first trimester terminations and one miscarriage. History of hysteroscopic polypectomy but no cervical LEEP/ [facility] in past. Patient was seen for Anatomy ultrasound in office on 03/30/16 and cervix ws short at 2 cm, she saw MFM on 04/01/16, findings confirmed and she was advised cervical cerclage and continue vaginal Prometrium as well.   Informed written consent was obtained after reviewing risks/ complications and future risk of infection/ miscarriage/ PROM and PTD. Understands and agrees.  Patient was brought to the operating room with IV running. She received preop Gentamicin and Clindamycin. She underwent Spinal anesthesia without complications. She was given dorsolithotomy position. Parts were prepped and draped in standard fashion. Bladder was catheterized with foley.  Speculum was placed and cervix was evaluated. External os appeared normal and closed and cervix appeared short (2 cm length). Bladder reflexion noted at cervicovaginal junction.  McDonald cerclage performed using Prolene 0, starting at 1 o'clock and going in anti-clock direction taking regular purse string stitches while grasping cervix with ring forceps. Knot tied at 1 o'clock.  Bleeding from needle entry- exit sites stopped after stitch was tied down. Single stitch cerclage done.  Hemostasis excellent. Instruments removed. All counts correct x2.   Patient will be discharged home today. Warning signs of infection and excessive bleeding and miscarriage precautions reviewed. We will see her back for cervical length sono in 1 week.   V.Angle Dirusso, MD.

## 2016-04-02 NOTE — Anesthesia Procedure Notes (Signed)
Spinal Patient location during procedure: OR Staffing Anesthesiologist: Adar Rase Preanesthetic Checklist Completed: patient identified, surgical consent, pre-op evaluation, timeout performed, IV checked, risks and benefits discussed and monitors and equipment checked Spinal Block Patient position: sitting Prep: site prepped and draped and DuraPrep Patient monitoring: heart rate, cardiac monitor, continuous pulse ox and blood pressure Approach: midline Location: L4-5 Injection technique: single-shot Needle Needle type: Pencan  Needle gauge: 24 G Needle length: 10 cm Assessment Sensory level: T8

## 2016-04-02 NOTE — MAU Note (Signed)
Patient presents for OR procedure for cerclage, denies pain or vaginal bleeding, positive FM

## 2016-04-02 NOTE — Progress Notes (Signed)
Pt waiting for car to be jumped, up to bathroom and able to void.

## 2016-04-07 ENCOUNTER — Encounter (HOSPITAL_COMMUNITY): Payer: Self-pay | Admitting: Obstetrics & Gynecology

## 2016-04-11 ENCOUNTER — Encounter: Payer: BLUE CROSS/BLUE SHIELD | Attending: Obstetrics & Gynecology | Admitting: Skilled Nursing Facility1

## 2016-04-11 VITALS — Ht 65.0 in | Wt 230.0 lb

## 2016-04-11 DIAGNOSIS — O24410 Gestational diabetes mellitus in pregnancy, diet controlled: Secondary | ICD-10-CM

## 2016-04-11 DIAGNOSIS — O99810 Abnormal glucose complicating pregnancy: Secondary | ICD-10-CM | POA: Insufficient documentation

## 2016-04-12 ENCOUNTER — Other Ambulatory Visit (HOSPITAL_COMMUNITY): Payer: Self-pay | Admitting: Obstetrics & Gynecology

## 2016-04-12 ENCOUNTER — Encounter: Payer: Self-pay | Admitting: Skilled Nursing Facility1

## 2016-04-12 DIAGNOSIS — O26872 Cervical shortening, second trimester: Secondary | ICD-10-CM

## 2016-04-12 DIAGNOSIS — Z3A21 21 weeks gestation of pregnancy: Secondary | ICD-10-CM

## 2016-04-12 NOTE — Progress Notes (Signed)
  Patient was seen on 04/12/2016 for Gestational Diabetes self-management class at the [facility]. The following learning objectives were met by the patient during this course:   States the definition of Gestational Diabetes  States why dietary management is important in controlling blood glucose  Describes the effects each nutrient has on blood glucose levels  Demonstrates ability to create a balanced meal plan  Demonstrates carbohydrate counting   States when to check blood glucose levels involving a total of 4 separate occurences in a day  Demonstrates proper blood glucose monitoring techniques  States the effect of stress and exercise on blood glucose levels  States the importance of limiting caffeine and abstaining from alcohol and smoking  Demonstrates the knowledge the glucometer provided in class may not be covered by their insurance and to call their insurance provider immediately after class to know which glucometer their insurance provider does cover as well as calling their physician the next day for a prescription to the glucometer their insurance does cover (if the one provided is not) as well as the lancets and strips for that meter.  Blood glucose monitor given: contour next one Lot # [id number] Exp: 11/24/2015 Blood glucose reading: WNL for fasting  Patient instructed to monitor glucose levels: FBS: 60 - <90 1 hour: <140 2 hour: <120  *Patient received handouts:  Nutrition Diabetes and Pregnancy  Carbohydrate Counting List  Patient will be seen for follow-up as needed.

## 2016-04-13 ENCOUNTER — Encounter (HOSPITAL_COMMUNITY): Payer: Self-pay

## 2016-04-13 ENCOUNTER — Other Ambulatory Visit (HOSPITAL_COMMUNITY): Payer: Self-pay | Admitting: *Deleted

## 2016-04-13 ENCOUNTER — Ambulatory Visit (HOSPITAL_COMMUNITY)
Admission: RE | Admit: 2016-04-13 | Discharge: 2016-04-13 | Disposition: A | Discharge status: Home / Self Care | Payer: BLUE CROSS/BLUE SHIELD | Source: Ambulatory Visit | Attending: Obstetrics & Gynecology | Admitting: Obstetrics & Gynecology

## 2016-04-13 DIAGNOSIS — O26872 Cervical shortening, second trimester: Secondary | ICD-10-CM | POA: Diagnosis not present

## 2016-04-13 DIAGNOSIS — O99212 Obesity complicating pregnancy, second trimester: Secondary | ICD-10-CM | POA: Insufficient documentation

## 2016-04-13 DIAGNOSIS — O3432 Maternal care for cervical incompetence, second trimester: Secondary | ICD-10-CM | POA: Diagnosis not present

## 2016-04-13 DIAGNOSIS — O3430 Maternal care for cervical incompetence, unspecified trimester: Secondary | ICD-10-CM

## 2016-04-13 DIAGNOSIS — Z3A21 21 weeks gestation of pregnancy: Secondary | ICD-10-CM | POA: Diagnosis not present

## 2016-04-22 ENCOUNTER — Encounter (HOSPITAL_COMMUNITY): Payer: Self-pay

## 2016-04-27 ENCOUNTER — Encounter (HOSPITAL_COMMUNITY): Payer: Self-pay

## 2016-04-27 ENCOUNTER — Ambulatory Visit (HOSPITAL_COMMUNITY)
Admission: RE | Admit: 2016-04-27 | Discharge: 2016-04-27 | Disposition: A | Discharge status: Home / Self Care | Payer: BLUE CROSS/BLUE SHIELD | Source: Ambulatory Visit | Attending: Obstetrics & Gynecology | Admitting: Obstetrics & Gynecology

## 2016-04-27 ENCOUNTER — Other Ambulatory Visit (HOSPITAL_COMMUNITY): Payer: Self-pay | Admitting: Obstetrics and Gynecology

## 2016-04-27 DIAGNOSIS — O99212 Obesity complicating pregnancy, second trimester: Secondary | ICD-10-CM | POA: Diagnosis not present

## 2016-04-27 DIAGNOSIS — O09522 Supervision of elderly multigravida, second trimester: Secondary | ICD-10-CM

## 2016-04-27 DIAGNOSIS — O24419 Gestational diabetes mellitus in pregnancy, unspecified control: Secondary | ICD-10-CM

## 2016-04-27 DIAGNOSIS — O3432 Maternal care for cervical incompetence, second trimester: Secondary | ICD-10-CM | POA: Diagnosis not present

## 2016-04-27 DIAGNOSIS — O3430 Maternal care for cervical incompetence, unspecified trimester: Secondary | ICD-10-CM

## 2016-04-27 DIAGNOSIS — E669 Obesity, unspecified: Secondary | ICD-10-CM | POA: Diagnosis not present

## 2016-04-27 DIAGNOSIS — Z3A23 23 weeks gestation of pregnancy: Secondary | ICD-10-CM

## 2016-05-04 ENCOUNTER — Ambulatory Visit (HOSPITAL_COMMUNITY)
Admission: RE | Admit: 2016-05-04 | Discharge: 2016-05-04 | Disposition: A | Discharge status: Home / Self Care | Payer: BLUE CROSS/BLUE SHIELD | Source: Ambulatory Visit | Attending: Obstetrics & Gynecology | Admitting: Obstetrics & Gynecology

## 2016-05-04 ENCOUNTER — Other Ambulatory Visit (HOSPITAL_COMMUNITY): Payer: Self-pay | Admitting: Maternal and Fetal Medicine

## 2016-05-04 ENCOUNTER — Encounter (HOSPITAL_COMMUNITY): Payer: Self-pay

## 2016-05-04 DIAGNOSIS — O09522 Supervision of elderly multigravida, second trimester: Secondary | ICD-10-CM

## 2016-05-04 DIAGNOSIS — O3432 Maternal care for cervical incompetence, second trimester: Secondary | ICD-10-CM

## 2016-05-04 DIAGNOSIS — O26872 Cervical shortening, second trimester: Secondary | ICD-10-CM

## 2016-05-04 DIAGNOSIS — O24419 Gestational diabetes mellitus in pregnancy, unspecified control: Secondary | ICD-10-CM

## 2016-05-04 DIAGNOSIS — O99212 Obesity complicating pregnancy, second trimester: Secondary | ICD-10-CM | POA: Insufficient documentation

## 2016-05-04 DIAGNOSIS — Z3A24 24 weeks gestation of pregnancy: Secondary | ICD-10-CM | POA: Diagnosis present

## 2016-05-04 DIAGNOSIS — E669 Obesity, unspecified: Secondary | ICD-10-CM | POA: Diagnosis not present

## 2016-05-04 IMAGING — US US MFM OB TRANSVAGINAL
1 series · 15 of 17 positions shown · non-contrast
Comparison: none

[Series 1: us mfm ob transvaginal · 17 acquisitions, 15 frames shown]
[im 1/17]
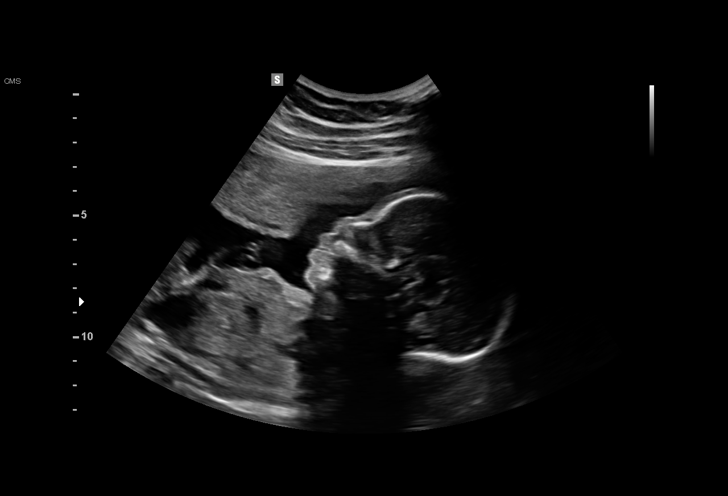
[im 2/17]
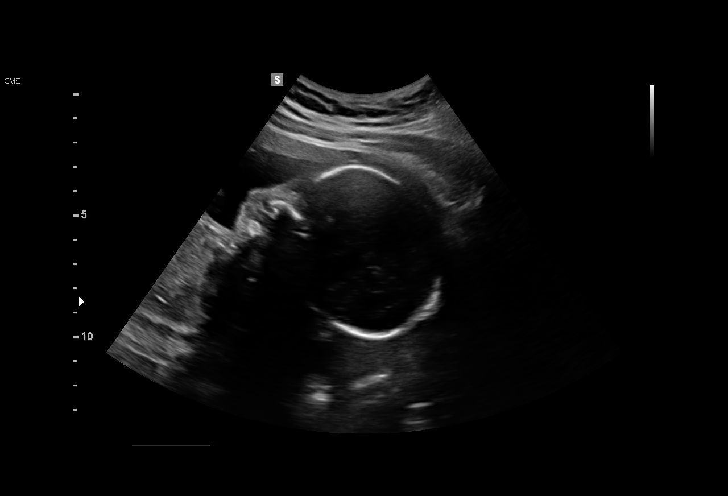
[im 3/17]
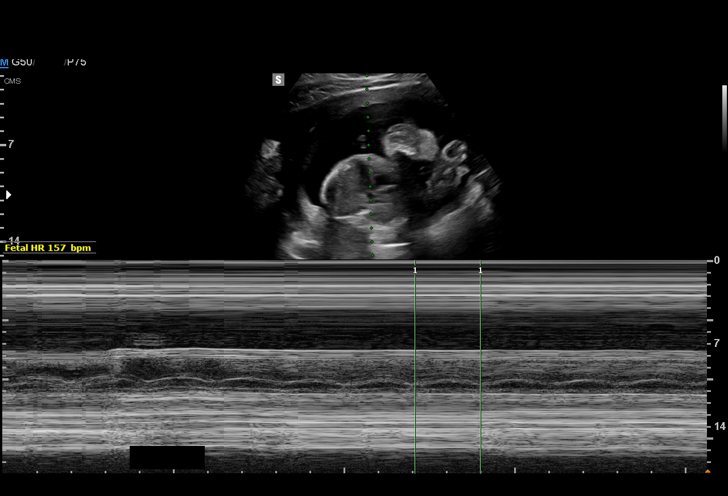
[im 4/17]
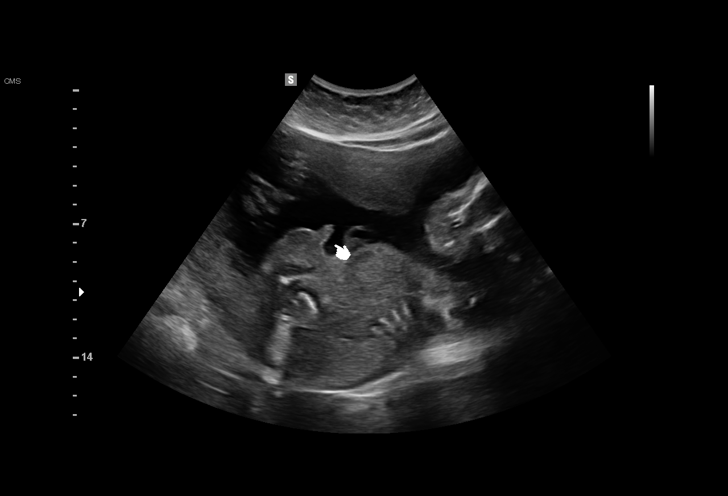
[im 6/17]
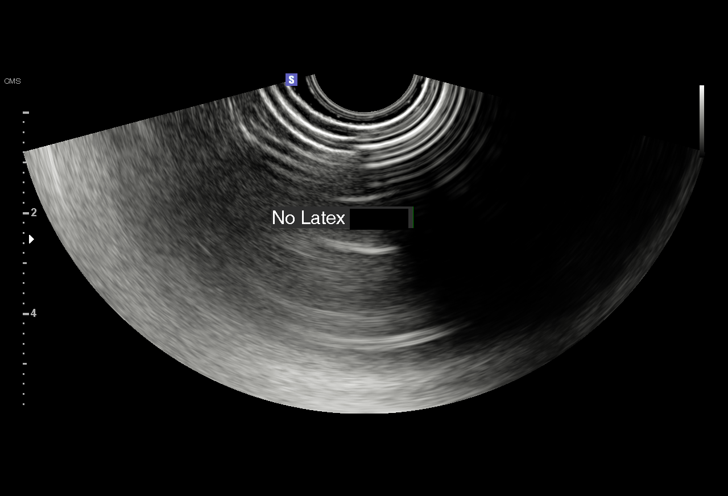
[im 7/17]
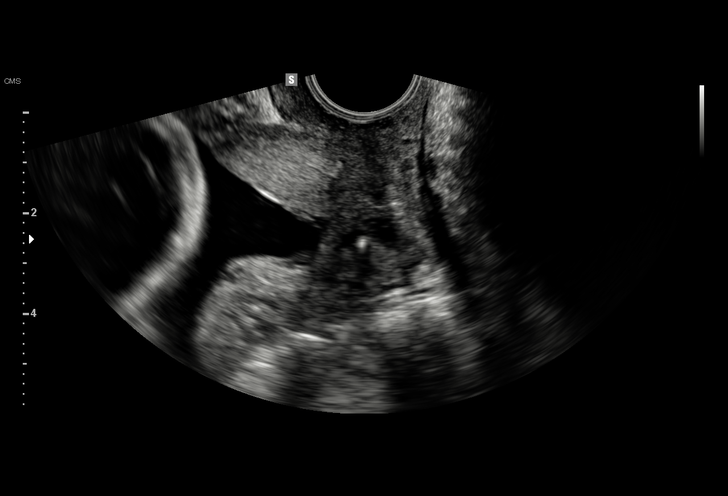
[im 8/17]
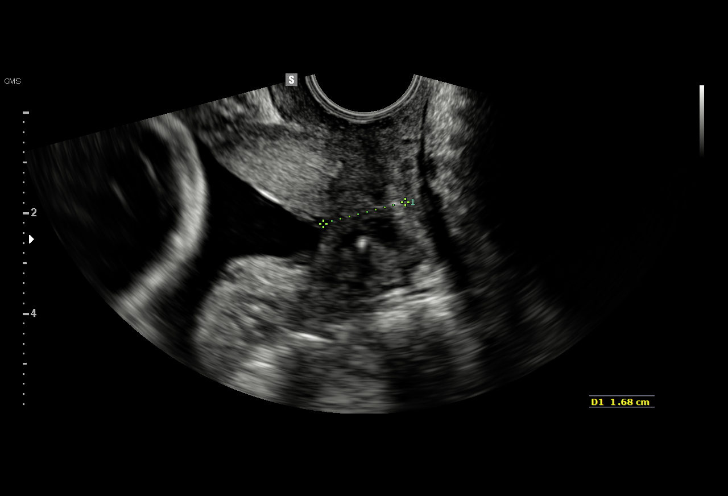
[im 9/17]
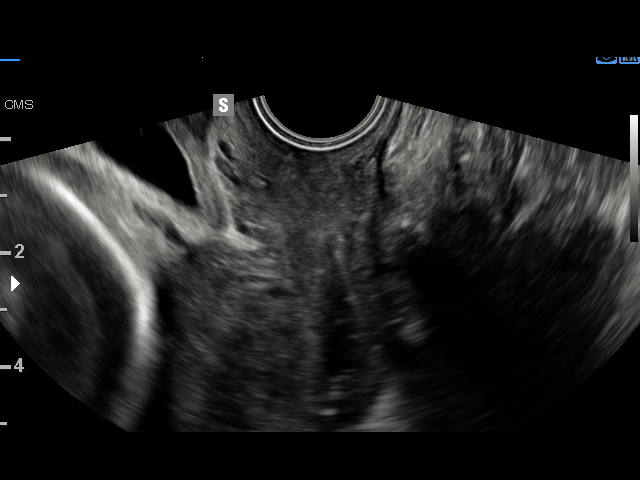
[im 10/17]
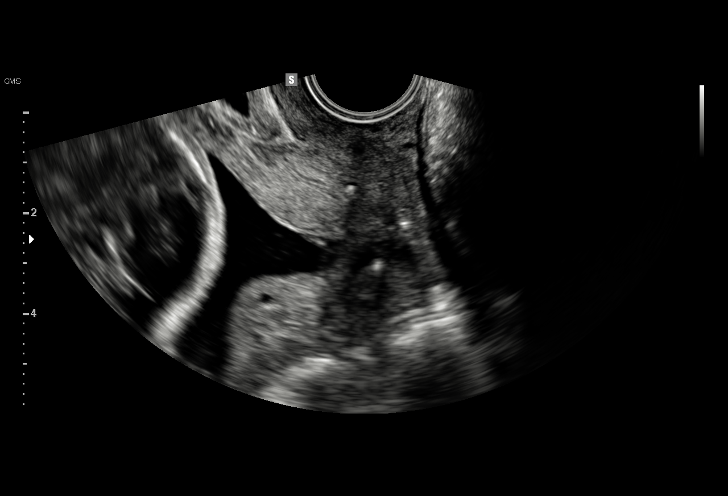
[im 11/17]
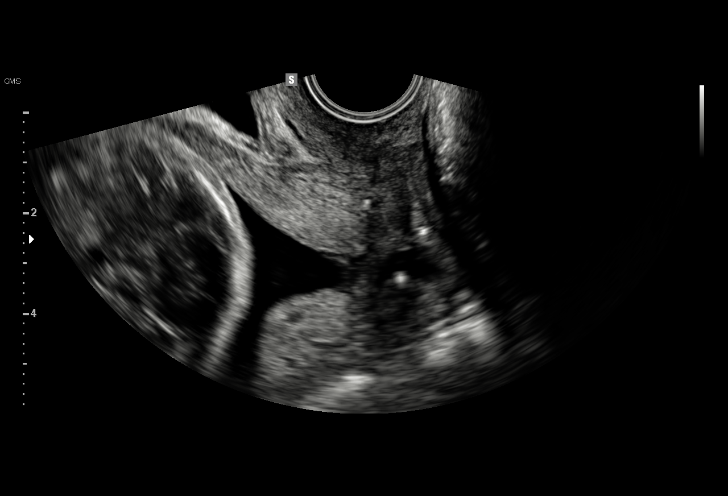
[im 12/17]
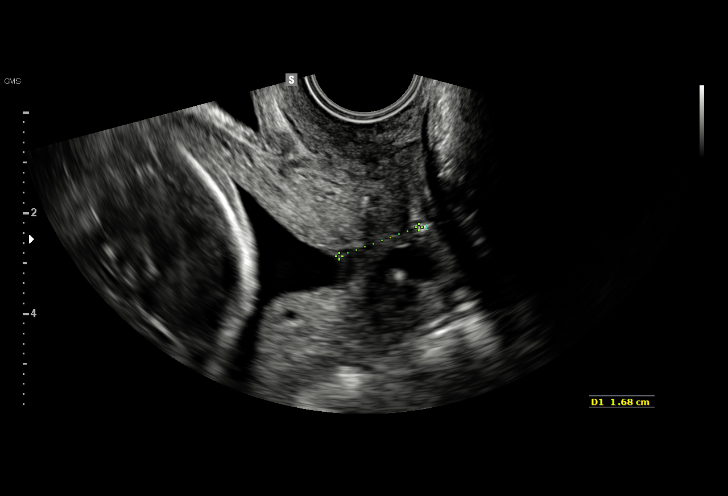
[im 14/17]
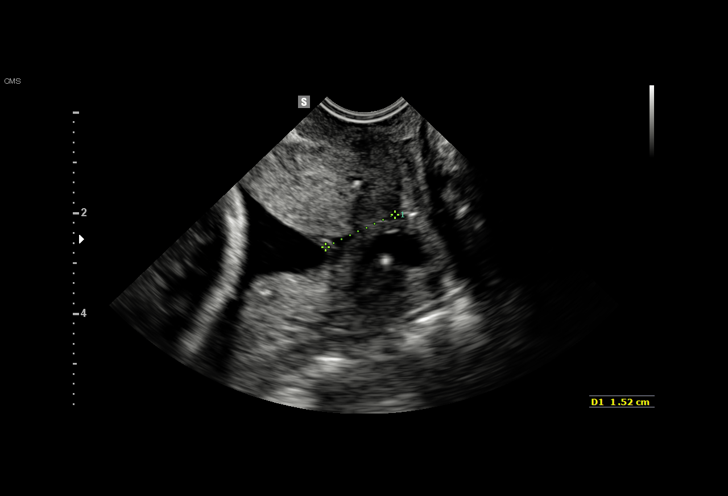
[im 15/17]
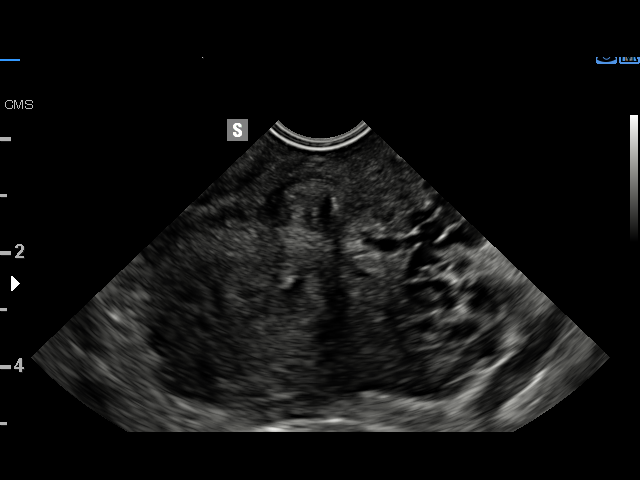
[im 16/17]
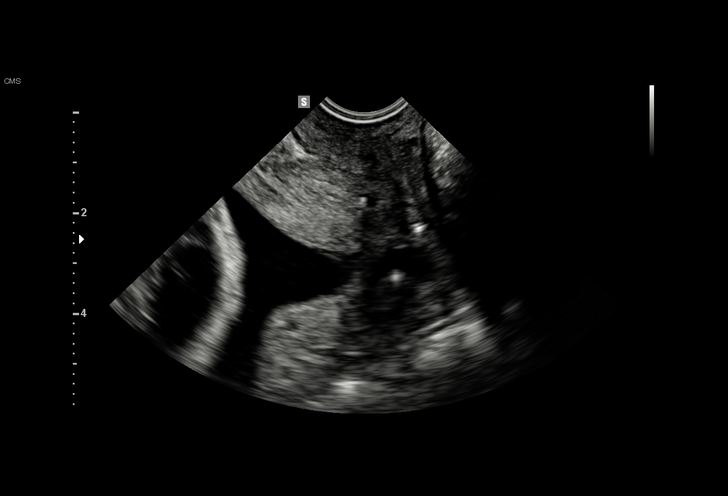
[im 17/17]
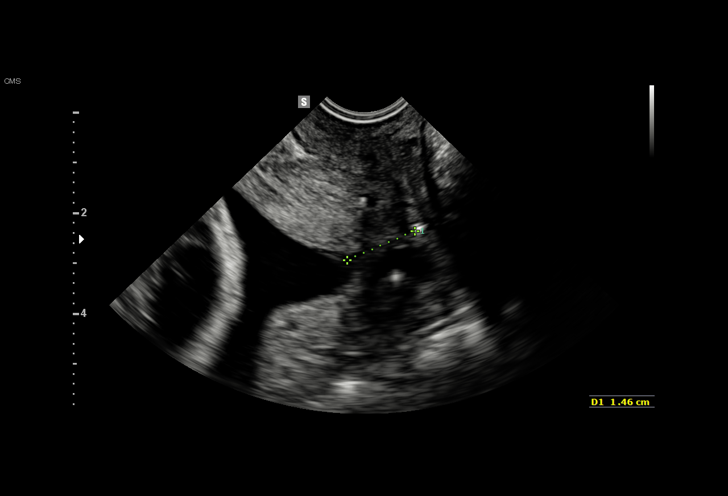

[15 of 17 positions shown; findings below may reference images not displayed]

1  DERICK            [PHONE_NUMBER]      [PHONE_NUMBER]     [PHONE_NUMBER]
Indications

24 weeks gestation of pregnancy
Cervical shortening, second trimester          [S0]
Cervical cerclage suture present, second       [S0]
trimester placed [DATE]
Obesity complicating pregnancy, second         [S0]
trimester
Advanced maternal age multigravida 35+,        [S0]
second trimester; declined testing
Gestational diabetes in pregnancy,             [S0]
unspecified control
OB History

Gravidity:    4          SAB:   3
Fetal Evaluation

Num Of Fetuses:     1
Fetal Heart         157
Rate(bpm):
Cardiac Activity:   Observed
Presentation:       Cephalic

Amniotic Fluid
AFI FV:      Subjectively within normal limits
Gestational Age

LMP:           24w 4d       Date:   [DATE]                 EDD:   [DATE]
Best:          24w 4d    Det. By:   LMP  ([DATE])          EDD:   [DATE]
Cervix Uterus Adnexa
Cervix
Length:            1.5  cm.
Measured transvaginally. Cerclage visualized.

Cul De Sac:   No free fluid seen.

Adnexa:       No abnormality visualized.
Impression

SIUP at 24+4 weeks
Normal amniotic fluid volume
EV views of cervix: funneling with distal closed portion
measuring 1.5 cms; suture visualized and appeared to be
intact
Recommendations

Continue vaginal progesterone
Follow cervix as clinically indicated
Agree with giving course of BMZ

## 2016-07-26 LAB — OB RESULTS CONSOLE GBS: GBS: NEGATIVE

## 2016-07-31 ENCOUNTER — Encounter (HOSPITAL_COMMUNITY): Payer: Self-pay | Admitting: Certified Nurse Midwife

## 2016-07-31 ENCOUNTER — Inpatient Hospital Stay (HOSPITAL_COMMUNITY)
Admission: AD | Admit: 2016-07-31 | Discharge: 2016-07-31 | Disposition: A | Discharge status: Home / Self Care | Payer: BLUE CROSS/BLUE SHIELD | Source: Ambulatory Visit | Attending: Obstetrics | Admitting: Obstetrics

## 2016-07-31 DIAGNOSIS — R102 Pelvic and perineal pain: Secondary | ICD-10-CM | POA: Insufficient documentation

## 2016-07-31 DIAGNOSIS — Z3A37 37 weeks gestation of pregnancy: Secondary | ICD-10-CM | POA: Diagnosis not present

## 2016-07-31 DIAGNOSIS — O26893 Other specified pregnancy related conditions, third trimester: Secondary | ICD-10-CM | POA: Diagnosis present

## 2016-07-31 LAB — URINALYSIS, ROUTINE W REFLEX MICROSCOPIC
BILIRUBIN URINE: NEGATIVE
Glucose, UA: NEGATIVE mg/dL
Ketones, ur: NEGATIVE mg/dL
NITRITE: NEGATIVE
Protein, ur: NEGATIVE mg/dL
pH: 7 (ref 5.0–8.0)

## 2016-07-31 LAB — URINE MICROSCOPIC-ADD ON: RBC / HPF: NONE SEEN RBC/hpf (ref 0–5)

## 2016-07-31 NOTE — MAU Note (Signed)
Pt states she guesses she is having contractions. States her abd gets really tight and she is having pressure in her vaginal area. Denies bleeding.

## 2016-07-31 NOTE — H&P (Addendum)
   Chief complaint: Pelvic pressure  History of present illness: 40 year old G4 P0030 at 37 weeks and 1 day presents with increased pelvic pressure over the last 2 days with worsening tonight. Patient was not sure if this represented labor and came in for labor check. Patient reports no leakage of fluid, no vaginal bleeding. Patient reports active fetal movement. Patient notes no headache, no vision change.  Of note patient had a rescue cerclage placed at 20 weeks and had that removed in the office 2 days ago.  Patient also notes urinary frequency without dysuria.   Obstetric history - History of TOP 3 - Cervical incompetence. Cervical shortening and dilation noted and rescue cerclage placed at 20 weeks. This was after progressive worsening of her cervix after starting vaginal Prometrium. - Obesity - A1 GDM   Past Medical History:  Diagnosis Date  . Gestational diabetes   . Obesity     Past Surgical History:  Procedure Laterality Date  . CERVICAL CERCLAGE N/A 04/02/2016   Procedure: Emergency McDonald CERVICAL CERCLAGE;  Surgeon: Shea EvansVaishali Mody, MD;  Location: [facility];  Service: Gynecology;  Laterality: N/A;  . DILATATION & CURRETTAGE/HYSTEROSCOPY WITH RESECTOCOPE  08/16/2012   Procedure: DILATATION & CURETTAGE/HYSTEROSCOPY WITH RESECTOCOPE;  Surgeon: Geryl RankinsEvelyn Varnado, MD;  Location: [facility];  Service: Gynecology;  Laterality: N/A;  . THERAPEUTIC ABORTION      Allergies: Amoxicillin  Physical exam: Vitals:   07/31/16 2024 07/31/16 2041 07/31/16 2049  BP: 154/95 140/79 135/86  Pulse: 89 87 84  Resp: 20    Temp: 97.8 F (36.6 C)    TempSrc: Oral    SpO2: 100%    Weight: 112.9 kg (249 lb)    Height: 5\' 6"  (1.676 m)     Gen.: Well-appearing, no distress Integumentary: Hirsute Abdomen: Gravid, nontender, no right upper quadrant pain GU: No blood, no gross rupture, cervix 2-3 cm dilated, 50% effaced, vertex -2, soft, mid position Lower extremity: Trace edema. Reflexes tested by  nurse: Normal  Toco: None FH: 135's, positive accelerations, no decelerations, 10 beat variability  Assessment and plan: 40 year old G4 P0030 at 37 weeks and 1 day with increased pelvic pressure 2 days following removal of cervical cerclage. - No evidence of active labor. Labor precautions reviewed with patient - Reactive fetal testing - Urinary frequency. Will check urinalysis and urine culture - GBS pending  Esma Kilts A. 07/31/2016 8:56 PM  \  Elevated bp upon admission but pt reports nervous when first arrived. No sx PEC, no exam findings PEC and bp stable. Has f/u in 3 days in office.   Eliel Dudding A. 07/31/2016 8:58 PM

## 2016-08-02 LAB — URINE CULTURE

## 2016-08-11 ENCOUNTER — Encounter (HOSPITAL_COMMUNITY): Payer: Self-pay | Admitting: *Deleted

## 2016-08-11 ENCOUNTER — Other Ambulatory Visit: Payer: Self-pay | Admitting: Obstetrics & Gynecology

## 2016-08-11 ENCOUNTER — Telehealth (HOSPITAL_COMMUNITY): Payer: Self-pay | Admitting: *Deleted

## 2016-08-11 NOTE — Telephone Encounter (Signed)
Preadmission screen  

## 2016-08-16 ENCOUNTER — Inpatient Hospital Stay (HOSPITAL_COMMUNITY)
Admission: RE | Admit: 2016-08-16 | Discharge: 2016-08-20 | DRG: 765 | Disposition: A | Discharge status: Home / Self Care | Payer: BLUE CROSS/BLUE SHIELD | Source: Ambulatory Visit | Attending: Obstetrics & Gynecology | Admitting: Obstetrics & Gynecology

## 2016-08-16 ENCOUNTER — Inpatient Hospital Stay (HOSPITAL_COMMUNITY): Payer: BLUE CROSS/BLUE SHIELD | Admitting: Anesthesiology

## 2016-08-16 ENCOUNTER — Encounter (HOSPITAL_COMMUNITY): Payer: Self-pay

## 2016-08-16 DIAGNOSIS — O134 Gestational [pregnancy-induced] hypertension without significant proteinuria, complicating childbirth: Secondary | ICD-10-CM | POA: Diagnosis present

## 2016-08-16 DIAGNOSIS — O3483 Maternal care for other abnormalities of pelvic organs, third trimester: Secondary | ICD-10-CM | POA: Diagnosis present

## 2016-08-16 DIAGNOSIS — N838 Other noninflammatory disorders of ovary, fallopian tube and broad ligament: Secondary | ICD-10-CM | POA: Diagnosis present

## 2016-08-16 DIAGNOSIS — O24919 Unspecified diabetes mellitus in pregnancy, unspecified trimester: Secondary | ICD-10-CM | POA: Diagnosis present

## 2016-08-16 DIAGNOSIS — Z823 Family history of stroke: Secondary | ICD-10-CM | POA: Diagnosis not present

## 2016-08-16 DIAGNOSIS — Z3A39 39 weeks gestation of pregnancy: Secondary | ICD-10-CM | POA: Diagnosis not present

## 2016-08-16 DIAGNOSIS — O24420 Gestational diabetes mellitus in childbirth, diet controlled: Secondary | ICD-10-CM | POA: Diagnosis present

## 2016-08-16 DIAGNOSIS — Z87891 Personal history of nicotine dependence: Secondary | ICD-10-CM | POA: Diagnosis not present

## 2016-08-16 DIAGNOSIS — O139 Gestational [pregnancy-induced] hypertension without significant proteinuria, unspecified trimester: Secondary | ICD-10-CM | POA: Diagnosis present

## 2016-08-16 DIAGNOSIS — O3433 Maternal care for cervical incompetence, third trimester: Secondary | ICD-10-CM | POA: Diagnosis present

## 2016-08-16 DIAGNOSIS — Z833 Family history of diabetes mellitus: Secondary | ICD-10-CM | POA: Diagnosis not present

## 2016-08-16 DIAGNOSIS — O99214 Obesity complicating childbirth: Secondary | ICD-10-CM | POA: Diagnosis present

## 2016-08-16 DIAGNOSIS — Z6841 Body Mass Index (BMI) 40.0 and over, adult: Secondary | ICD-10-CM | POA: Diagnosis not present

## 2016-08-16 DIAGNOSIS — E282 Polycystic ovarian syndrome: Secondary | ICD-10-CM | POA: Diagnosis present

## 2016-08-16 DIAGNOSIS — Z8249 Family history of ischemic heart disease and other diseases of the circulatory system: Secondary | ICD-10-CM

## 2016-08-16 LAB — CBC
HCT: 31.5 % — ABNORMAL LOW (ref 36.0–46.0)
HEMOGLOBIN: 10.4 g/dL — AB (ref 12.0–15.0)
MCH: 26.9 pg (ref 26.0–34.0)
MCHC: 33 g/dL (ref 30.0–36.0)
MCV: 81.6 fL (ref 78.0–100.0)
Platelets: 223 10*3/uL (ref 150–400)
RBC: 3.86 MIL/uL — ABNORMAL LOW (ref 3.87–5.11)
RDW: 14 % (ref 11.5–15.5)
WBC: 6.6 10*3/uL (ref 4.0–10.5)

## 2016-08-16 LAB — GLUCOSE, CAPILLARY
GLUCOSE-CAPILLARY: 108 mg/dL — AB (ref 65–99)
GLUCOSE-CAPILLARY: 76 mg/dL (ref 65–99)
GLUCOSE-CAPILLARY: 92 mg/dL (ref 65–99)
Glucose-Capillary: 102 mg/dL — ABNORMAL HIGH (ref 65–99)
Glucose-Capillary: 93 mg/dL (ref 65–99)

## 2016-08-16 LAB — TYPE AND SCREEN
ABO/RH(D): A POS
Antibody Screen: NEGATIVE

## 2016-08-16 LAB — RPR: RPR Ser Ql: NONREACTIVE

## 2016-08-16 MED ORDER — LACTATED RINGERS IV SOLN
INTRAVENOUS | Status: DC
  Administered 2016-08-16 – 2016-08-17 (×2): via INTRAVENOUS

## 2016-08-16 MED ORDER — DIPHENHYDRAMINE HCL 50 MG/ML IJ SOLN: 12.5000 mg | INTRAMUSCULAR | Status: DC | PRN

## 2016-08-16 MED ORDER — OXYTOCIN 40 UNITS IN LACTATED RINGERS INFUSION - SIMPLE MED: 2.5000 [IU]/h | INTRAVENOUS | Status: DC

## 2016-08-16 MED ORDER — EPHEDRINE 5 MG/ML INJ: 10.0000 mg | INTRAVENOUS | Status: DC | PRN

## 2016-08-16 MED ORDER — OXYTOCIN 40 UNITS IN LACTATED RINGERS INFUSION - SIMPLE MED
1.0000 m[IU]/min | INTRAVENOUS | Status: DC
  Administered 2016-08-16: 3 m[IU]/min via INTRAVENOUS
  Administered 2016-08-16: 2 m[IU]/min via INTRAVENOUS
  Administered 2016-08-16: 10 m[IU]/min via INTRAVENOUS
  Administered 2016-08-16: 6 m[IU]/min via INTRAVENOUS
  Administered 2016-08-16: 8 m[IU]/min via INTRAVENOUS
  Administered 2016-08-16: 2 m[IU]/min via INTRAVENOUS
  Administered 2016-08-16: 4 m[IU]/min via INTRAVENOUS
  Filled 2016-08-16: qty 1000

## 2016-08-16 MED ORDER — LACTATED RINGERS IV SOLN: 500.0000 mL | Freq: Once | INTRAVENOUS | Status: DC

## 2016-08-16 MED ORDER — PHENYLEPHRINE 40 MCG/ML (10ML) SYRINGE FOR IV PUSH (FOR BLOOD PRESSURE SUPPORT): 80.0000 ug | PREFILLED_SYRINGE | INTRAVENOUS | Status: DC | PRN

## 2016-08-16 MED ORDER — LIDOCAINE HCL (PF) 1 % IJ SOLN: 30.0000 mL | INTRAMUSCULAR | Status: DC | PRN

## 2016-08-16 MED ORDER — SOD CITRATE-CITRIC ACID 500-334 MG/5ML PO SOLN
30.0000 mL | ORAL | Status: DC | PRN
  Administered 2016-08-17: 30 mL via ORAL
  Filled 2016-08-16: qty 15

## 2016-08-16 MED ORDER — BUTORPHANOL TARTRATE 1 MG/ML IJ SOLN
1.0000 mg | INTRAMUSCULAR | Status: DC
Start: 2016-08-16 — End: 2016-08-16

## 2016-08-16 MED ORDER — OXYCODONE-ACETAMINOPHEN 5-325 MG PO TABS: 1.0000 | ORAL_TABLET | ORAL | Status: DC | PRN

## 2016-08-16 MED ORDER — OXYTOCIN BOLUS FROM INFUSION: 500.0000 mL | Freq: Once | INTRAVENOUS | Status: DC

## 2016-08-16 MED ORDER — ONDANSETRON HCL 4 MG/2ML IJ SOLN
4.0000 mg | Freq: Four times a day (QID) | INTRAMUSCULAR | Status: DC | PRN
  Administered 2016-08-16: 4 mg via INTRAVENOUS
  Filled 2016-08-16: qty 2

## 2016-08-16 MED ORDER — LACTATED RINGERS IV SOLN
500.0000 mL | INTRAVENOUS | Status: DC | PRN
Start: 2016-08-16 — End: 2016-08-17

## 2016-08-16 MED ORDER — OXYCODONE-ACETAMINOPHEN 5-325 MG PO TABS: 2.0000 | ORAL_TABLET | ORAL | Status: DC | PRN

## 2016-08-16 MED ORDER — ACETAMINOPHEN 325 MG PO TABS: 650.0000 mg | ORAL_TABLET | ORAL | Status: DC | PRN

## 2016-08-16 MED ORDER — FENTANYL 2.5 MCG/ML BUPIVACAINE 1/10 % EPIDURAL INFUSION (WH - ANES)
14.0000 mL/h | INTRAMUSCULAR | Status: DC | PRN
  Administered 2016-08-16 (×2): 14 mL/h via EPIDURAL
  Filled 2016-08-16 (×2): qty 100

## 2016-08-16 MED ORDER — BUTORPHANOL TARTRATE 1 MG/ML IJ SOLN
1.0000 mg | INTRAMUSCULAR | Status: DC | PRN
  Filled 2016-08-16: qty 1

## 2016-08-16 MED ORDER — FLEET ENEMA 7-19 GM/118ML RE ENEM: 1.0000 | ENEMA | RECTAL | Status: DC | PRN

## 2016-08-16 MED ORDER — TERBUTALINE SULFATE 1 MG/ML IJ SOLN
0.2500 mg | Freq: Once | INTRAMUSCULAR | Status: DC | PRN
Start: 2016-08-16 — End: 2016-08-16

## 2016-08-16 MED ORDER — LIDOCAINE HCL (PF) 1 % IJ SOLN
INTRAMUSCULAR | Status: DC | PRN
  Administered 2016-08-16: 4 mL via EPIDURAL
  Administered 2016-08-16: 6 mL via EPIDURAL

## 2016-08-16 MED ORDER — TERBUTALINE SULFATE 1 MG/ML IJ SOLN: 0.2500 mg | Freq: Once | INTRAMUSCULAR | Status: DC | PRN

## 2016-08-16 MED ORDER — PHENYLEPHRINE 40 MCG/ML (10ML) SYRINGE FOR IV PUSH (FOR BLOOD PRESSURE SUPPORT)
80.0000 ug | PREFILLED_SYRINGE | INTRAVENOUS | Status: DC | PRN
  Filled 2016-08-16: qty 10

## 2016-08-16 NOTE — H&P (Signed)
Danielle Huerta is a 40 y.o. female G5P0040, 39.3 wks. Here for IOL for GDM-A1, AMA and slightly elevated BPs in office last wk.  PCOS Hx. Clomid conception.  Short cervix 2 cm at Anatomy sono at 18 wks, underwent emergency McDonald cerclage at 19 wks and it was removed in office at 36+ wks.  A1GDM well controlled per pt. Growth sono AGA.   OB History    Gravida Para Term Preterm AB Living   5 0 0 0 4 0   SAB TAB Ectopic Multiple Live Births   1 3 0 0       Past Medical History:  Diagnosis Date  . Gestational diabetes   . History of female hirsutism   . Obesity   . PCOS (polycystic ovarian syndrome)    Past Surgical History:  Procedure Laterality Date  . CERVICAL CERCLAGE N/A 04/02/2016   Procedure: Emergency McDonald CERVICAL CERCLAGE;  Surgeon: Shea EvansVaishali Manya Balash, MD;  Location: [facility];  Service: Gynecology;  Laterality: N/A;  . DILATATION & CURRETTAGE/HYSTEROSCOPY WITH RESECTOCOPE  08/16/2012   Procedure: DILATATION & CURETTAGE/HYSTEROSCOPY WITH RESECTOCOPE;  Surgeon: Geryl RankinsEvelyn Varnado, MD;  Location: [facility];  Service: Gynecology;  Laterality: N/A;  . THERAPEUTIC ABORTION     Family History: family history includes Cancer in her maternal aunt and maternal uncle; Diabetes in her father; Heart disease in her father; Stroke in her father. Social History:  reports that she has quit smoking. Her smoking use included Cigarettes. She has a 3.75 pack-year smoking history. She has never used smokeless tobacco. She reports that she drinks alcohol. She reports that she does not use drugs.     Maternal Diabetes: Yes:  Diabetes Type:  Diet controlled Genetic Screening: Declined Maternal Ultrasounds/Referrals: Normal Fetal Ultrasounds or other Referrals:  None Maternal Substance Abuse:  No Significant Maternal Medications:  None Significant Maternal Lab Results:  Lab values include: Group B Strep negative Other Comments:  None  ROS neg  History Dilation: 4 Effacement (%): 90 Station: -3 Exam  by:: Allee Busk MD  Blood pressure (!) 149/86, pulse 99, temperature 98.4 F (36.9 C), temperature source Oral, resp. rate 18, height 5\' 6"  (1.676 m), weight 253 lb (114.8 kg), last menstrual period 11/14/2015. Exam Physical Exam  A&O x 3, no acute distress. Pleasant HEENT neg, no thyromegaly Lungs CTA bilat CV RRR, S1S2 normal Abdo soft, non tender, non acute Extr no edema/ tenderness Pelvic above. AROM, slight mec stained fluid. Vtx high but well applied FHT  135/ category I Toco on pitocin q 3 min  Prenatal labs: ABO, Rh: --/--/A POS (11/21 [id number]) Antibody: NEG (11/21 [id number]) Rubella: Nonimmune (05/10 0000) RPR: Nonreactive (05/10 0000)  HBsAg: Negative (05/10 0000)  HIV: Non-reactive (05/10 0000)  GBS: Negative (10/31 0000)   Assessment/Plan: 40 yo female with short cervix and cerclage in pregnancy. A1GDM at 39.3 wks, IOL for GDM, AMA.  Pitocin per protocol. EPidural vs pain meds options reviewed.  Watch descent.   Danielle Huerta R 08/16/2016, 2:00 PM

## 2016-08-16 NOTE — Progress Notes (Signed)
S: Doing well, no complaints, pain well controlled with epidural  O: BP 128/75 (BP Location: Left Arm)   Pulse 95   Temp 99.2 F (37.3 C) (Axillary)   Resp 18   Ht 5\' 6"  (1.676 m)   Wt 114.8 kg (253 lb)   LMP 11/14/2015   SpO2 100%   BMI 40.84 kg/m    FHT:  FHR: 145s bpm, variability: moderate,  accelerations:  Present,  decelerations:  Absent UC:   irregular, every 2-5 minutes MVU's about 140 SVE:   Dilation: 6 Effacement (%): 90 Station: -3 Exam by:: Foglemen No change in station but some increase in caput and molding  A / P:  40 y.o.  Obstetric History   G5   P0   T0   P0   A4   L0    SAB1   TAB3   Ectopic0   Multiple0   Live Births0    at 4571w3d IOL for gest htn and A1DM, no progress in many hours, IUPC placed 4 hours ago and just now getting back to adequacy by MVU. high station and lack of descent concerning for CPD.  Fetal Wellbeing:  Category I Pain Control:  Epidural  Anticipated MOD:  likley PCS for arrest of dilation/ CPD, will allow another 2 hrs then recheck.   Danielle Huerta A. 08/16/2016, 11:04 PM

## 2016-08-16 NOTE — Anesthesia Preprocedure Evaluation (Signed)
Anesthesia Evaluation  Patient identified by MRN, date of birth, ID band Patient awake    Reviewed: Allergy & Precautions, NPO status , Patient's Chart, lab work & pertinent test results  History of Anesthesia Complications Negative for: history of anesthetic complications  Airway Mallampati: II  TM Distance: >3 FB Neck ROM: Full    Dental  (+) Teeth Intact   Pulmonary former smoker,    Pulmonary exam normal breath sounds clear to auscultation       Cardiovascular negative cardio ROS   Rhythm:Regular Rate:Normal     Neuro/Psych negative neurological ROS     GI/Hepatic negative GI ROS, Neg liver ROS,   Endo/Other  diabetes, GestationalMorbid obesity  Renal/GU negative Renal ROS     Musculoskeletal   Abdominal (+) + obese,   Peds  Hematology   Anesthesia Other Findings PCOS  Reproductive/Obstetrics (+) Pregnancy                             Anesthesia Physical Anesthesia Plan  ASA: II  Anesthesia Plan: Epidural   Post-op Pain Management:    Induction:   Airway Management Planned: Natural Airway  Additional Equipment:   Intra-op Plan:   Post-operative Plan:   Informed Consent: I have reviewed the patients History and Physical, chart, labs and discussed the procedure including the risks, benefits and alternatives for the proposed anesthesia with the patient or authorized representative who has indicated his/her understanding and acceptance.     Plan Discussed with:   Anesthesia Plan Comments: (I have discussed risks of neuraxial anesthesia including but not limited to infection, bleeding, nerve injury, back pain, headache, seizures, and failure of block. Patient denies bleeding disorders and is not currently anticoagulated. Labs have been reviewed. Risks and benefits discussed. All patient's questions answered.   Platelets 223)        Anesthesia Quick Evaluation

## 2016-08-16 NOTE — Progress Notes (Signed)
Dr Ernestina PennaFogleman at pt bs SVE 6/90/-3. Cont poc and Dr Ernestina PennaFogleman will reevaluate pt at 0100 on 08/17/16.

## 2016-08-16 NOTE — Progress Notes (Signed)
Pt requested information about Advance Directive.  I explained the paperwork to her and she plans to fill it out at a later time.  Chaplain Dyanne CarrelKaty Deanndra Kirley, Bcc Pager, [phone] 12:10 PM    08/16/16 1200  Clinical Encounter Type  Visited With Patient  Visit Type (Advance Directive)  Referral From Nurse

## 2016-08-16 NOTE — Progress Notes (Signed)
Provided pt with paperwork and went over it with her.  She plans to fill it out at a later time.  Chaplain Dyanne CarrelKaty Iylah Dworkin, Bcc Pager, [phone] 12:13 PM    08/16/16 1100  Advance Directives (For Healthcare)  Does Patient Have a Medical Advance Directive? No  Would patient like information on creating a medical advance directive? Yes (Inpatient - patient requests chaplain consult to create a medical advance directive)

## 2016-08-16 NOTE — Anesthesia Pain Management Evaluation Note (Signed)
  CRNA Pain Management Visit Note  Patient: Danielle Huerta, 40 y.o., female  "Hello I am a member of the anesthesia team at [facility]. We have an anesthesia team available at all times to provide care throughout the hospital, including epidural management and anesthesia for C-section. I don't know your plan for the delivery whether it a natural birth, water birth, IV sedation, nitrous supplementation, doula or epidural, but we want to meet your pain goals."   1.Was your pain managed to your expectations on prior hospitalizations?   No prior hospitalizations  2.What is your expectation for pain management during this hospitalization?     Epidural  3.How can we help you reach that goal? Epidural   Record the patient's initial score and the patient's pain goal.   Pain: 8  Pain Goal: 8 [facility] wants you to be able to say your pain was always managed very well.  Danielle Huerta,Danielle Huerta 08/16/2016

## 2016-08-16 NOTE — Progress Notes (Signed)
S: Doing well, no complaints, pain well controlled with epidural  O: BP (!) 152/99   Pulse (!) 107   Temp 97.7 F (36.5 C) (Oral)   Resp 18   Ht 5' 6" (1.676 m)   Wt 114.8 kg (253 lb)   LMP 11/14/2015   SpO2 100%   BMI 40.84 kg/m    FHT:  FHR: 145s bpm, variability: moderate,  accelerations:  Present,  decelerations:  Absent UC:   regular, every 2-3 minutes SVE:   Dilation: 6 Effacement (%): 90 Station: -3 Exam by:: [employer] RN  No change over several hours  CBC    Component Value Date/Time   WBC 6.6 08/16/2016 0830   RBC 3.86 (L) 08/16/2016 0830   HGB 10.4 (L) 08/16/2016 0830   HCT 31.5 (L) 08/16/2016 0830   PLT 223 08/16/2016 0830   MCV 81.6 08/16/2016 0830   MCH 26.9 08/16/2016 0830   MCHC 33.0 08/16/2016 0830   RDW 14.0 08/16/2016 0830   LYMPHSABS 1.4 06/21/2009 2140   MONOABS 0.5 06/21/2009 2140   EOSABS 0.1 06/21/2009 2140   BASOSABS 0.0 06/21/2009 2140    CMP     Component Value Date/Time   NA 141 08/05/2010 2022   K 3.9 08/05/2010 2022   CL 104 08/05/2010 2022   CO2 28 08/05/2010 2022   GLUCOSE 104 (H) 08/05/2010 2022   BUN 9 08/05/2010 2022   CREATININE 0.91 08/05/2010 2022   CALCIUM 9.3 08/05/2010 2022   GFRNONAA >60 06/21/2009 2140   GFRAA  06/21/2009 2140    >60        The eGFR has been calculated using the MDRD equation. This calculation has not been validated in all clinical situations. eGFR's persistently <60 mL/min signify possible Chronic Kidney Disease.     A / P:  40 y.o.  Obstetric History   G5   P0   T0   P0   A4   L0    SAB1   TAB3   Ectopic0   Multiple0   Live Births0    at 107w3dIOL for AMA, A1GDM and gestational htn. No progress over several hours, fetal station remains high and hematuria noted. IUPC placed, will titrate pitocin to adequacy and if no further cervical change or descent may need PCS.  A1GDM, BS q 4 hrs  Fetal Wellbeing:  Category I Pain Control:  Epidural  Anticipated MOD:  unclear, lack of  cervical change and fetal descent concerning for need for c/s   Neave Lenger A. 08/16/2016, 7:14 PM

## 2016-08-16 NOTE — Anesthesia Procedure Notes (Signed)
Epidural Patient location during procedure: OB Start time: 08/16/2016 2:15 PM End time: 08/16/2016 2:22 PM  Staffing Anesthesiologist: Linton RumpALLAN, Hetvi Shawhan DICKERSON Performed: anesthesiologist   Preanesthetic Checklist Completed: patient identified, surgical consent, pre-op evaluation, timeout performed, IV checked, risks and benefits discussed and monitors and equipment checked  Epidural Patient position: sitting Prep: site prepped and draped and DuraPrep Patient monitoring: continuous pulse ox and blood pressure Approach: midline Location: L2-L3 Injection technique: LOR air  Needle:  Needle type: Tuohy  Needle gauge: 17 G Needle length: 9 cm and 9 Needle insertion depth: 7 cm Catheter type: closed end flexible Catheter size: 19 Gauge Catheter at skin depth: 12 cm Test dose: negative  Assessment Events: blood not aspirated, injection not painful, no injection resistance, negative IV test and no paresthesia  Additional Notes Epidural placed easily on first attempt.Reason for block:procedure for pain

## 2016-08-16 NOTE — Progress Notes (Signed)
Dr Ernestina PennaFogleman verbalized at nurse station to restart pitocin at  2mu and titrated pitocin 2mu every  30 mins per protocol. Dr Ernestina PennaFogleman requested to be notified verbally if pt unable to tolerate pitocin per protocol. Pending receiving complete pt report from off going Rn (charity B. Rn).

## 2016-08-17 ENCOUNTER — Encounter (HOSPITAL_COMMUNITY)
Admission: RE | Disposition: A | Discharge status: Home / Self Care | Payer: Self-pay | Source: Ambulatory Visit | Attending: Obstetrics & Gynecology

## 2016-08-17 ENCOUNTER — Encounter (HOSPITAL_COMMUNITY): Payer: Self-pay

## 2016-08-17 LAB — COMPREHENSIVE METABOLIC PANEL
ALT: 16 U/L (ref 14–54)
ANION GAP: 8 (ref 5–15)
AST: 35 U/L (ref 15–41)
Albumin: 2.7 g/dL — ABNORMAL LOW (ref 3.5–5.0)
Alkaline Phosphatase: 82 U/L (ref 38–126)
BILIRUBIN TOTAL: 0.9 mg/dL (ref 0.3–1.2)
BUN: 6 mg/dL (ref 6–20)
CO2: 22 mmol/L (ref 22–32)
Calcium: 8.7 mg/dL — ABNORMAL LOW (ref 8.9–10.3)
Chloride: 105 mmol/L (ref 101–111)
Creatinine, Ser: 0.88 mg/dL (ref 0.44–1.00)
GFR calc Af Amer: >60 mL/min (ref >60)
Glucose, Bld: 109 mg/dL — ABNORMAL HIGH (ref 65–99)
POTASSIUM: 4 mmol/L (ref 3.5–5.1)
Sodium: 135 mmol/L (ref 135–145)
TOTAL PROTEIN: 6.4 g/dL — AB (ref 6.5–8.1)

## 2016-08-17 LAB — CBC
HEMATOCRIT: 27.4 % — AB (ref 36.0–46.0)
HEMATOCRIT: 30.3 % — AB (ref 36.0–46.0)
HEMOGLOBIN: 9.4 g/dL — AB (ref 12.0–15.0)
Hemoglobin: 10.1 g/dL — ABNORMAL LOW (ref 12.0–15.0)
MCH: 27.8 pg (ref 26.0–34.0)
MCH: 27.2 pg (ref 26.0–34.0)
MCHC: 34.3 g/dL (ref 30.0–36.0)
MCHC: 33.3 g/dL (ref 30.0–36.0)
MCV: 81.1 fL (ref 78.0–100.0)
MCV: 81.5 fL (ref 78.0–100.0)
Platelets: 171 10*3/uL (ref 150–400)
Platelets: 196 10*3/uL (ref 150–400)
RBC: 3.38 MIL/uL — ABNORMAL LOW (ref 3.87–5.11)
RBC: 3.72 MIL/uL — ABNORMAL LOW (ref 3.87–5.11)
RDW: 14.3 % (ref 11.5–15.5)
RDW: 14.2 % (ref 11.5–15.5)
WBC: 8.9 10*3/uL (ref 4.0–10.5)
WBC: 10 10*3/uL (ref 4.0–10.5)

## 2016-08-17 LAB — GLUCOSE, CAPILLARY: Glucose-Capillary: 134 mg/dL — ABNORMAL HIGH (ref 65–99)

## 2016-08-17 SURGERY — Surgical Case
Anesthesia: Epidural

## 2016-08-17 MED ORDER — MEASLES, MUMPS & RUBELLA VAC ~~LOC~~ INJ
0.5000 mL | INJECTION | Freq: Once | SUBCUTANEOUS | Status: DC
  Filled 2016-08-17: qty 0.5

## 2016-08-17 MED ORDER — WITCH HAZEL-GLYCERIN EX PADS: 1.0000 "application " | MEDICATED_PAD | CUTANEOUS | Status: DC | PRN

## 2016-08-17 MED ORDER — KETOROLAC TROMETHAMINE 30 MG/ML IJ SOLN
30.0000 mg | Freq: Four times a day (QID) | INTRAMUSCULAR | Status: AC | PRN
  Administered 2016-08-17: 30 mg via INTRAMUSCULAR

## 2016-08-17 MED ORDER — SCOPOLAMINE 1 MG/3DAYS TD PT72
MEDICATED_PATCH | TRANSDERMAL | Status: DC | PRN
  Administered 2016-08-17: 1 via TRANSDERMAL

## 2016-08-17 MED ORDER — OXYCODONE HCL 5 MG PO TABS: 10.0000 mg | ORAL_TABLET | ORAL | Status: DC | PRN

## 2016-08-17 MED ORDER — KETOROLAC TROMETHAMINE 30 MG/ML IJ SOLN: 30.0000 mg | Freq: Four times a day (QID) | INTRAMUSCULAR | Status: AC | PRN

## 2016-08-17 MED ORDER — DEXTROSE 5 % IV SOLN
Freq: Once | INTRAVENOUS | Status: AC
  Administered 2016-08-17: 100 mL via INTRAVENOUS
  Filled 2016-08-17: qty 10.25

## 2016-08-17 MED ORDER — ONDANSETRON HCL 4 MG/2ML IJ SOLN
INTRAMUSCULAR | Status: DC | PRN
  Administered 2016-08-17: 4 mg via INTRAVENOUS

## 2016-08-17 MED ORDER — OXYTOCIN 10 UNIT/ML IJ SOLN
INTRAMUSCULAR | Status: AC
  Filled 2016-08-17: qty 4

## 2016-08-17 MED ORDER — OXYCODONE HCL 5 MG PO TABS
5.0000 mg | ORAL_TABLET | ORAL | Status: DC | PRN
  Administered 2016-08-18 – 2016-08-20 (×3): 5 mg via ORAL
  Filled 2016-08-17 (×3): qty 1

## 2016-08-17 MED ORDER — IBUPROFEN 600 MG PO TABS
600.0000 mg | ORAL_TABLET | Freq: Four times a day (QID) | ORAL | Status: DC
  Administered 2016-08-17 – 2016-08-20 (×11): 600 mg via ORAL
  Filled 2016-08-17 (×11): qty 1

## 2016-08-17 MED ORDER — SIMETHICONE 80 MG PO CHEW
80.0000 mg | CHEWABLE_TABLET | ORAL | Status: DC
  Administered 2016-08-17 – 2016-08-19 (×3): 80 mg via ORAL
  Filled 2016-08-17 (×3): qty 1

## 2016-08-17 MED ORDER — MORPHINE SULFATE (PF) 0.5 MG/ML IJ SOLN
INTRAMUSCULAR | Status: DC | PRN
  Administered 2016-08-17: 3 mg via EPIDURAL
  Administered 2016-08-17: 2 mg via INTRAVENOUS

## 2016-08-17 MED ORDER — DEXAMETHASONE SODIUM PHOSPHATE 4 MG/ML IJ SOLN
INTRAMUSCULAR | Status: AC
Start: 2016-08-17 — End: 2016-08-17
  Filled 2016-08-17: qty 1

## 2016-08-17 MED ORDER — SENNOSIDES-DOCUSATE SODIUM 8.6-50 MG PO TABS
2.0000 | ORAL_TABLET | ORAL | Status: DC
  Administered 2016-08-17 – 2016-08-19 (×3): 2 via ORAL
  Filled 2016-08-17 (×3): qty 2

## 2016-08-17 MED ORDER — SCOPOLAMINE 1 MG/3DAYS TD PT72
MEDICATED_PATCH | TRANSDERMAL | Status: AC
  Filled 2016-08-17: qty 1

## 2016-08-17 MED ORDER — MENTHOL 3 MG MT LOZG: 1.0000 | LOZENGE | OROMUCOSAL | Status: DC | PRN

## 2016-08-17 MED ORDER — LACTATED RINGERS IV BOLUS (SEPSIS)
500.0000 mL | Freq: Once | INTRAVENOUS | Status: AC
  Administered 2016-08-17: 500 mL via INTRAVENOUS

## 2016-08-17 MED ORDER — TETANUS-DIPHTH-ACELL PERTUSSIS 5-2.5-18.5 LF-MCG/0.5 IM SUSP: 0.5000 mL | Freq: Once | INTRAMUSCULAR | Status: DC

## 2016-08-17 MED ORDER — DEXAMETHASONE SODIUM PHOSPHATE 4 MG/ML IJ SOLN
INTRAMUSCULAR | Status: DC | PRN
  Administered 2016-08-17: 4 mg via INTRAVENOUS

## 2016-08-17 MED ORDER — SIMETHICONE 80 MG PO CHEW
80.0000 mg | CHEWABLE_TABLET | Freq: Three times a day (TID) | ORAL | Status: DC
  Administered 2016-08-17 – 2016-08-20 (×9): 80 mg via ORAL
  Filled 2016-08-17 (×9): qty 1

## 2016-08-17 MED ORDER — LACTATED RINGERS IV SOLN
INTRAVENOUS | Status: DC | PRN
  Administered 2016-08-17: 02:00:00 via INTRAVENOUS

## 2016-08-17 MED ORDER — PRENATAL MULTIVITAMIN CH
1.0000 | ORAL_TABLET | Freq: Every day | ORAL | Status: DC
  Administered 2016-08-17 – 2016-08-19 (×3): 1 via ORAL
  Filled 2016-08-17 (×3): qty 1

## 2016-08-17 MED ORDER — KETOROLAC TROMETHAMINE 30 MG/ML IJ SOLN
INTRAMUSCULAR | Status: AC
  Filled 2016-08-17: qty 1

## 2016-08-17 MED ORDER — COCONUT OIL OIL: 1.0000 "application " | TOPICAL_OIL | Status: DC | PRN

## 2016-08-17 MED ORDER — SODIUM CHLORIDE 0.9 % IR SOLN
Status: DC | PRN
  Administered 2016-08-17: 1000 mL

## 2016-08-17 MED ORDER — ZOLPIDEM TARTRATE 5 MG PO TABS: 5.0000 mg | ORAL_TABLET | Freq: Every evening | ORAL | Status: DC | PRN

## 2016-08-17 MED ORDER — SODIUM BICARBONATE 8.4 % IV SOLN
INTRAVENOUS | Status: DC | PRN
  Administered 2016-08-17 (×2): 5 mL via EPIDURAL

## 2016-08-17 MED ORDER — OXYTOCIN 40 UNITS IN LACTATED RINGERS INFUSION - SIMPLE MED: 2.5000 [IU]/h | INTRAVENOUS | Status: AC

## 2016-08-17 MED ORDER — LACTATED RINGERS IV SOLN
INTRAVENOUS | Status: DC | PRN
  Administered 2016-08-17: 40 [IU] via INTRAVENOUS

## 2016-08-17 MED ORDER — LACTATED RINGERS IV SOLN
INTRAVENOUS | Status: DC
  Administered 2016-08-17 (×2): via INTRAVENOUS

## 2016-08-17 MED ORDER — ACETAMINOPHEN 325 MG PO TABS
650.0000 mg | ORAL_TABLET | ORAL | Status: DC | PRN
  Administered 2016-08-18: 650 mg via ORAL
  Filled 2016-08-17: qty 2

## 2016-08-17 MED ORDER — SIMETHICONE 80 MG PO CHEW: 80.0000 mg | CHEWABLE_TABLET | ORAL | Status: DC | PRN

## 2016-08-17 MED ORDER — DIPHENHYDRAMINE HCL 25 MG PO CAPS: 25.0000 mg | ORAL_CAPSULE | Freq: Four times a day (QID) | ORAL | Status: DC | PRN

## 2016-08-17 MED ORDER — MORPHINE SULFATE (PF) 0.5 MG/ML IJ SOLN
INTRAMUSCULAR | Status: AC
  Filled 2016-08-17: qty 10

## 2016-08-17 MED ORDER — DIBUCAINE 1 % RE OINT: 1.0000 "application " | TOPICAL_OINTMENT | RECTAL | Status: DC | PRN

## 2016-08-17 MED ORDER — ONDANSETRON HCL 4 MG/2ML IJ SOLN
INTRAMUSCULAR | Status: AC
  Filled 2016-08-17: qty 2

## 2016-08-17 SURGICAL SUPPLY — 34 items
BENZOIN TINCTURE PRP APPL 2/3 (GAUZE/BANDAGES/DRESSINGS) ×2 IMPLANT
CHLORAPREP W/TINT 26ML (MISCELLANEOUS) ×2 IMPLANT
CLAMP CORD UMBIL (MISCELLANEOUS) IMPLANT
CLOSURE STERI STRIP 1/2 X4 (GAUZE/BANDAGES/DRESSINGS) ×2 IMPLANT
CLOTH BEACON ORANGE TIMEOUT ST (SAFETY) ×2 IMPLANT
CONTAINER PREFILL 10% NBF 15ML (MISCELLANEOUS) IMPLANT
DRSG OPSITE POSTOP 4X10 (GAUZE/BANDAGES/DRESSINGS) ×2 IMPLANT
ELECT REM PT RETURN 9FT ADLT (ELECTROSURGICAL) ×2
ELECTRODE REM PT RTRN 9FT ADLT (ELECTROSURGICAL) ×1 IMPLANT
EXTRACTOR VACUUM M CUP 4 TUBE (SUCTIONS) IMPLANT
GLOVE BIO SURGEON STRL SZ 6.5 (GLOVE) ×2 IMPLANT
GLOVE BIOGEL PI IND STRL 7.0 (GLOVE) ×2 IMPLANT
GLOVE BIOGEL PI INDICATOR 7.0 (GLOVE) ×2
GOWN STRL REUS W/TWL LRG LVL3 (GOWN DISPOSABLE) ×4 IMPLANT
KIT ABG SYR 3ML LUER SLIP (SYRINGE) IMPLANT
NEEDLE HYPO 22GX1.5 SAFETY (NEEDLE) IMPLANT
NEEDLE HYPO 25X5/8 SAFETYGLIDE (NEEDLE) IMPLANT
NS IRRIG 1000ML POUR BTL (IV SOLUTION) ×2 IMPLANT
PACK C SECTION WH (CUSTOM PROCEDURE TRAY) ×2 IMPLANT
PAD OB MATERNITY 4.3X12.25 (PERSONAL CARE ITEMS) ×2 IMPLANT
PENCIL SMOKE EVAC W/HOLSTER (ELECTROSURGICAL) ×2 IMPLANT
STRIP CLOSURE SKIN 1/2X4 (GAUZE/BANDAGES/DRESSINGS) IMPLANT
SUT MON AB 4-0 PS1 27 (SUTURE) ×2 IMPLANT
SUT PLAIN 0 NONE (SUTURE) IMPLANT
SUT PLAIN 2 0 XLH (SUTURE) ×2 IMPLANT
SUT VIC AB 0 CT1 36 (SUTURE) ×4 IMPLANT
SUT VIC AB 0 CTX 36 (SUTURE) ×3
SUT VIC AB 0 CTX36XBRD ANBCTRL (SUTURE) ×3 IMPLANT
SUT VIC AB 2-0 CT1 (SUTURE) ×2 IMPLANT
SUT VIC AB 2-0 CT1 27 (SUTURE) ×1
SUT VIC AB 2-0 CT1 TAPERPNT 27 (SUTURE) ×1 IMPLANT
SYR CONTROL 10ML LL (SYRINGE) IMPLANT
TOWEL OR 17X24 6PK STRL BLUE (TOWEL DISPOSABLE) ×2 IMPLANT
TRAY FOLEY CATH SILVER 14FR (SET/KITS/TRAYS/PACK) IMPLANT

## 2016-08-17 NOTE — Addendum Note (Signed)
Addendum  created 08/17/16 1628 by Shanon PayorSuzanne M [place] Langstaff, CRNA   Sign clinical note

## 2016-08-17 NOTE — Progress Notes (Signed)
S: Doing well, no complaints, pain increasing and rocking in pain with contractions, previously pain well controlle with epidural  O: BP (!) 150/83 (BP Location: Left Arm)   Pulse (!) 105   Temp 99.9 F (37.7 C) (Axillary)   Resp 18   Ht 5\' 6"  (1.676 m)   Wt 114.8 kg (253 lb)   LMP 11/14/2015   SpO2 100%   BMI 40.84 kg/m    FHT:  FHR: 145s bpm, variability: moderate,  accelerations:  Present,  decelerations:  Absent UC:   regular, every 2 minutes SVE:   Dilation: 6 Effacement (%): 90 Station: -3 Exam by:: Foglemen   A / P:  40 y.o.  Obstetric History   G5   P0   T0   P0   A4   L0    SAB1   TAB3   Ectopic0   Multiple0   Live Births0    at 7935w4d IOL, gest htn, A1DM, AMA now with arrest of descent  Fetal Wellbeing:  Category I Pain Control:  Epidural  Anticipated MOD:  move to PCS. r/b d/w pt.  Garhett Bernhard A. 08/17/2016, 1:09 AM

## 2016-08-17 NOTE — Lactation Note (Signed)
This note was copied from a baby's chart. Lactation Consultation Note  Patient Name: Boy Carrie Mewashira Puopolo ZOXWR'UToday's Date: 08/17/2016 Reason for consult: [facility]bottle [facility].   Maternal Data    Feeding    LATCH Score/Interventions                      Lactation Tools Discussed/Used     Consult Status Consult Status: Complete    Alfred LevinsGranger, Windle Huebert Ann 08/17/2016, 2:29 PM

## 2016-08-17 NOTE — Brief Op Note (Signed)
08/16/2016 - 08/17/2016  2:46 AM  PATIENT:  Danielle Huerta  40 y.o. female  PRE-OPERATIVE DIAGNOSIS:  Arrest of dilation  primary Cesarean Section Gestational hypertension Advanced maternal age Cervical incompetence Gestational diabetes  POST-OPERATIVE DIAGNOSIS:  Arrest of dilation  primary Cesarean Section Gestational hypertension Advanced maternal age Cervical incompetence Gestational diabetes   PROCEDURE:  Procedure(s): CESAREAN SECTION (N/A) Primary low-transverse cesarean section with 2 layer closure  SURGEON:  Surgeon(s) and Role:    * Noland FordyceKelly Kavitha Lansdale, MD - Primary  PHYSICIAN ASSISTANT:   ASSISTANTSArita Miss: Dawson, CNM   ANESTHESIA:   epidural  EBL:  Total I/O In: 1000 [I.V.:1000] Out: 1750 [Urine:1000; Blood:750]  BLOOD ADMINISTERED:none  DRAINS: Urinary Catheter (Foley)   LOCAL MEDICATIONS USED:  NONE  SPECIMEN:  Source of Specimen:  Placenta  DISPOSITION OF SPECIMEN:  Labor and delivery  COUNTS:  YES  TOURNIQUET:  * No tourniquets in log *  DICTATION: .Note written in EPIC  PLAN OF CARE: Admit to inpatient   PATIENT DISPOSITION:  PACU - hemodynamically stable.   Delay start of Pharmacological VTE agent (>24hrs) due to surgical blood loss or risk of bleeding: yes

## 2016-08-17 NOTE — Transfer of Care (Signed)
Immediate Anesthesia Transfer of Care Note  Patient: Danielle Huerta  Procedure(s) Performed: Procedure(s): CESAREAN SECTION (N/A)  Patient Location: PACU  Anesthesia Type:Epidural  Level of Consciousness: awake, alert  and oriented  Airway & Oxygen Therapy: Patient Spontanous Breathing  Post-op Assessment: Report given to RN and Post -op Vital signs reviewed and stable  Post vital signs: Reviewed and stable  Last Vitals:  Vitals:   08/17/16 0030 08/17/16 0100  BP: (!) 150/83 135/76  Pulse: (!) 105 (!) 136  Resp: 18 (!) 21  Temp:  (!) 38.2 C    Last Pain:  Vitals:   08/17/16 0100  TempSrc: Axillary  PainSc:          Complications: No apparent anesthesia complications

## 2016-08-17 NOTE — Anesthesia Postprocedure Evaluation (Signed)
Anesthesia Post Note  Patient: Danielle Huerta  Procedure(s) Performed: Procedure(s) (LRB): CESAREAN SECTION (N/A)  Patient location during evaluation: PACU Anesthesia Type: Epidural Level of consciousness: awake and alert Pain management: pain level controlled Vital Signs Assessment: post-procedure vital signs reviewed and stable Respiratory status: spontaneous breathing, nonlabored ventilation and respiratory function stable Cardiovascular status: stable Postop Assessment: no headache, no backache and epidural receding Anesthetic complications: no     Last Vitals:  Vitals:   08/17/16 0407 08/17/16 0408  BP:    Pulse: 96 94  Resp: 17 (!) 30  Temp:      Last Pain:  Vitals:   08/17/16 0329  TempSrc: Axillary  PainSc:    Pain Goal:                 Shelton SilvasKevin D Hien Perreira

## 2016-08-17 NOTE — Progress Notes (Signed)
POSTOPERATIVE DAY # 0 S/P CS - arrest of dilation / PIH Interval note  VS: BP 136/64 (BP Location: Left Arm)   Pulse 89   Temp 97.8 F (36.6 C) (Oral)   Resp 20   Ht 5' 6" (1.676 m)   Wt 114.8 kg (253 lb)   LMP 11/14/2015   SpO2 98%   Breastfeeding? Unknown   BMI 40.84 kg/m   LABS:  SGOT 35 / SGPT 16 / creatinine 0.8              Recent Labs  08/17/16 0346 08/17/16 0521  WBC 8.9 10.0  HGB 9.4* 10.1*  PLT 171 196               Bloodtype: --/--/A POS (11/21 0823)  Rubella: Nonimmune (05/10 0000) - MMR ordered                                    I&O: Intake/Output      11/21 0701 - 11/22 0700 11/22 0701 - 11/23 0700   I.V. (mL/kg) 1500 (13.1)    Total Intake(mL/kg) 1500 (13.1)    Urine (mL/kg/hr) 1375    Blood 750    Total Output 2125     Net -625           A:        POD # 0 S/P CS            PIH - gestational hypertension / no evidence of PEC            BP and labs stable  P:        Routine postoperative care                BAILEY, TANYA CNM, MSN, FACNM 08/17/2016, 8:49 AM  

## 2016-08-17 NOTE — Anesthesia Postprocedure Evaluation (Signed)
Anesthesia Post Note  Patient: Danielle Huerta  Procedure(s) Performed: Procedure(s) (LRB): CESAREAN SECTION (N/A)  Patient location during evaluation: Mother Baby Anesthesia Type: Epidural Level of consciousness: awake and alert and oriented Pain management: pain level controlled Vital Signs Assessment: post-procedure vital signs reviewed and stable Respiratory status: spontaneous breathing and nonlabored ventilation Cardiovascular status: stable Postop Assessment: no headache, no backache, patient able to bend at knees, no signs of nausea or vomiting and adequate PO intake Anesthetic complications: no     Last Vitals:  Vitals:   08/17/16 0736 08/17/16 1539  BP: 136/64 110/60  Pulse: 89 76  Resp: 20 20  Temp: 36.6 C 36.3 C    Last Pain:  Vitals:   08/17/16 1539  TempSrc: Oral  PainSc:    Pain Goal:                 Madison HickmanGREGORY,[name]

## 2016-08-17 NOTE — Op Note (Signed)
08/16/2016 - 08/17/2016  2:46 AM  PATIENT:  Danielle Huerta  40 y.o. female  PRE-OPERATIVE DIAGNOSIS:  Arrest of dilation  primary Cesarean Section Gestational hypertension Advanced maternal age Cervical incompetence Gestational diabetes  POST-OPERATIVE DIAGNOSIS:  Arrest of dilation  primary Cesarean Section Gestational hypertension Advanced maternal age Cervical incompetence Gestational diabetes   PROCEDURE:  Procedure(s): CESAREAN SECTION (N/A) Primary low-transverse cesarean section with 2 layer closure  SURGEON:  Surgeon(s) and Role:    * Noland FordyceKelly Nabeel Gladson, MD - Primary  PHYSICIAN ASSISTANT:   ASSISTANTSArita Miss: Dawson, CNM   ANESTHESIA:   epidural  EBL:  Total I/O In: 1000 [I.V.:1000] Out: 1750 [Urine:1000; Blood:750]  BLOOD ADMINISTERED:none  DRAINS: Urinary Catheter (Foley)   LOCAL MEDICATIONS USED:  NONE  SPECIMEN:  Source of Specimen:  Placenta  DISPOSITION OF SPECIMEN:  Labor and delivery  COUNTS:  YES  TOURNIQUET:  * No tourniquets in log *  DICTATION: .Note written in EPIC  PLAN OF CARE: Admit to inpatient   PATIENT DISPOSITION:  PACU - hemodynamically stable.   Delay start of Pharmacological VTE agent (>24hrs) due to surgical blood loss or risk of bleeding: yes    Findings:  @BABYSEXEBC @ infant,  APGAR (1 MIN): 9   APGAR (5 MINS): 9   APGAR (10 MINS):   Normal uterus, tubes and ovaries, normal placenta. 3VC, meconium-stained amniotic fluid, 2 small right paratubal cyst  EBL: 750 cc Antibiotics:  Gentamicin and clindamycin Complications: none  Indications: This is a 40 y.o. year-old, G5 P0  At 7177w4d admitted for induction of labor due to gestational diabetes, favorable cervix, gestational hypertension. Patient had induction of labor with Pitocin and artificial rupture of membrane. Patient remained at 6 cm for greater than 6 hours despite adequate contractions by [place] units with an IUPC. Fetal station remained high. Discussion with patient  over diagnosis of arrest of dilation and risks benefits and alternatives of the procedure were discussed with the patient who agreed to proceed with primary cesarean section.  Procedure:  After informed consent was obtained the patient was taken to the operating room where epidural anesthesia was found to be adequate.  She was prepped and draped in the normal sterile fashion in dorsal supine position with a leftward tilt.  A foley catheter was in place.  A Pfannenstiel skin incision was made 2 cm above the pubic symphysis in the midline with the scalpel.  Dissection was carried down with the Bovie cautery until the fascia was reached. The fascia was incised in the midline. The incision was extended laterally with the Mayo scissors. Brisk bleeding was noted from the right side. A perforating vessel between the muscle and the fascia had been transected and attempt to control this was cautery was done. Continued bleeding was noted in the vessel had retracted into the rectus muscle. A clamp was placed on the rectus muscle and additional cautery was done however upon removing the clamp,  the muscle tissue  was torn and continued to bleed. A figure-of-eight suture of 0 Vicryl was placed and this controlled hemostasis . The inferior aspect of the fascial incision was grasped with the Coker clamps, elevated up and the underlying rectus muscles were dissected off sharply. The superior aspect of the fascial incision was grasped with the Coker clamps elevated up and the underlying rectus muscles were dissected off sharply.  The peritoneum was entered sharply. The peritoneal incision was extended superiorly and inferiorly with good visualization of the bladder. The bladder blade was inserted and palpation was  done to assess the fetal position and the location of the uterine vessels. The head was felt to be low in the pelvis and a nurse placed her hand on the fetal vertex and elevated the head. The lower segment of the uterus  was incised sharply with the scalpel and extended  bluntly in the cephalo-caudal fashion. The infant was grasped, brought to the incision,  rotated and the infant was delivered with fundal pressure. Vigorous cry was noted and the decision was made to allow for 1 minute delayed cord clamping . The cord was clamped and cut. The infant was handed off to the waiting pediatrician. The placenta was expressed. The uterus was exteriorized. The uterus was cleared of all clots and debris. Small bilateral inferior extensions were noted in each closed with a figure-of-eight suture of 0 Vicryl. The uterine incision was repaired with 0 Vicryl in a running locked fashion.  A second layer of the same suture was used in an imbricating fashion to obtain excellent hemostasis.  The uterus was then returned to the abdomen, the gutters were cleared of all clots and debris. The uterine incision was reinspected and found to be hemostatic. 2 paratubal cysts were excised or drained. The peritoneum was grasped and closed with 2-0 Vicryl in a running fashion. The cut muscle edges and the underside of the fascia were inspected and found to be hemostatic. The fascia was closed with 0 Vicryl in a single layer. The subcutaneous tissue was irrigated. Scarpa's layer was closed with a 2-0 plain gut suture. The skin was closed with a 4-0 Monocryl in a single layer. The patient tolerated the procedure well. Sponge lap and needle counts were correct x3 and patient was taken to the recovery room in a stable condition.  Syndi Pua A. 08/17/2016 2:48 AM

## 2016-08-18 DIAGNOSIS — O139 Gestational [pregnancy-induced] hypertension without significant proteinuria, unspecified trimester: Secondary | ICD-10-CM | POA: Diagnosis present

## 2016-08-18 LAB — GLUCOSE, CAPILLARY: Glucose-Capillary: 85 mg/dL (ref 65–99)

## 2016-08-18 MED ORDER — NALBUPHINE HCL 10 MG/ML IJ SOLN: 5.0000 mg | Freq: Once | INTRAMUSCULAR | Status: DC | PRN

## 2016-08-18 MED ORDER — SODIUM CHLORIDE 0.9% FLUSH: 3.0000 mL | INTRAVENOUS | Status: DC | PRN

## 2016-08-18 MED ORDER — NALOXONE HCL 0.4 MG/ML IJ SOLN: 0.4000 mg | INTRAMUSCULAR | Status: DC | PRN

## 2016-08-18 MED ORDER — DIPHENHYDRAMINE HCL 25 MG PO CAPS: 25.0000 mg | ORAL_CAPSULE | ORAL | Status: DC | PRN

## 2016-08-18 MED ORDER — NALBUPHINE HCL 10 MG/ML IJ SOLN: 5.0000 mg | INTRAMUSCULAR | Status: DC | PRN

## 2016-08-18 MED ORDER — DIPHENHYDRAMINE HCL 50 MG/ML IJ SOLN: 12.5000 mg | INTRAMUSCULAR | Status: DC | PRN

## 2016-08-18 MED ORDER — ONDANSETRON HCL 4 MG/2ML IJ SOLN: 4.0000 mg | Freq: Three times a day (TID) | INTRAMUSCULAR | Status: DC | PRN

## 2016-08-18 MED ORDER — SCOPOLAMINE 1 MG/3DAYS TD PT72
1.0000 | MEDICATED_PATCH | Freq: Once | TRANSDERMAL | Status: DC
  Filled 2016-08-18: qty 1

## 2016-08-18 MED ORDER — NALOXONE HCL 2 MG/2ML IJ SOSY
1.0000 ug/kg/h | PREFILLED_SYRINGE | INTRAMUSCULAR | Status: DC | PRN
  Filled 2016-08-18: qty 2

## 2016-08-18 NOTE — Progress Notes (Signed)
Subjective: POD# 1 Information for the patient's newborn:  Malva LimesShird, Boy Lalitha [[id number]][[id number]]  female   circ planned Baby name: Danie ChandlerKaiel Tyrone  Reports feeling well, no concerns today. Feeding: bottle but wants to try breast Patient reports tolerating PO.  Breast symptoms: none Pain controlled withPO meds Denies HA/SOB/C/P/N/V/dizziness. Flatus present. She reports vaginal bleeding as normal, without clots.  She is ambulating, urinating without difficulty.     Objective:   VS:    Vitals:   08/17/16 1539 08/17/16 1855 08/17/16 1940 08/18/16 0515  BP: 110/60 117/60 114/62 (!) 134/53  Pulse: 76 76 72 80  Resp: 20 18 18 18   Temp: 97.4 F (36.3 C) 97.9 F (36.6 C) 98.1 F (36.7 C) 98.2 F (36.8 C)  TempSrc: Oral Oral Oral Oral  SpO2: 98%  99%   Weight:      Height:         Intake/Output Summary (Last 24 hours) at 08/18/16 0827 Last data filed at 08/18/16 0710  Gross per 24 hour  Intake          1110.42 ml  Output             1610 ml  Net          -499.58 ml        Recent Labs  08/17/16 0346 08/17/16 0521  WBC 8.9 10.0  HGB 9.4* 10.1*  HCT 27.4* 30.3*  PLT 171 196     Blood type: --/--/A POS (11/21 40980823)  Rubella: Nonimmune (05/10 0000)     Physical Exam:  General: alert, cooperative and no distress CV: Regular rate and rhythm Resp: clear Abdomen: soft, nontender, normal bowel sounds Incision: clean, dry and intact Uterine Fundus: firm, below umbilicus, nontender Lochia: minimal Ext: edema trace, no cords or tenderness      Assessment/Plan: 40 y.o.   POD# 1. [id number]                  Principal Problem:   Postpartum care following cesarean delivery (11/22) Indication: Arrest of Dilation / 6 cm Active Problems:   Pregnancy diabetes   Gestational hypertension  - normotensive, diuresing well, no s/s PEC  Doing well, stable.               Advance diet as tolerated Encourage rest when baby rests Breastfeeding support Encourage to ambulate Routine  post-op care  Neta Mendsaniela C Paul, CNM, MSN 08/18/2016, 8:27 AM

## 2016-08-19 NOTE — Lactation Note (Signed)
This note was copied from a baby's chart. Lactation Consultation Note  Mother decided at 50 hours to breastfeed. At [phone] [id number] she breastfed 15 min on both breasts for a total of 30 min. Reviewed hand expression.  Breastmilk was easily expressed. Mother states baby recently was given 30 ml of formula. Recommend she breastfeed before offering formula to help establish her milk supply. Mother's nipples are evert and compressible. Attempted latching in football hold.  Baby latched, sucked a few times and fell asleep.  Retried but seems baby is full from formula feeding. Mom encouraged to feed baby 8-12 times/24 hours and with feeding cues.  Encouraged depth and discussed basics. Pacifier use not recommended at this time.  Mom made aware of O/P services, breastfeeding support groups, community resources, and our phone # for post-discharge questions.      Patient Name: Danielle Carrie Mewashira Scheibel RUEAV'WToday's Date: 08/19/2016 Reason for consult: Follow-up assessment   Maternal Data Has patient been taught Hand Expression?: Yes Does the patient have breastfeeding experience prior to this delivery?: No  Feeding    LATCH Score/Interventions                      Lactation Tools Discussed/Used     Consult Status Consult Status: Follow-up Date: 08/20/16 Follow-up type: In-patient    Dahlia ByesBerkelhammer, Philomene Haff [facility] 08/19/2016, 2:26 PM

## 2016-08-19 NOTE — Progress Notes (Signed)
Subjective: POD# 2 Information for the patient's newborn:  Malva LimesShird, Boy Muskaan [[id number]][[id number]]  female   circ completed Baby name: Merlinda FrederickKariel Tyrone  Reports feeling well. Feeding: breast and bottle Patient reports tolerating PO.  Breast symptoms: no pain Pain controlled withibuprofen (OTC) Denies HA/SOB/C/P/N/V/dizziness. Flatus present. She reports vaginal bleeding as normal, without clots.  She is ambulating, urinating without difficulty.     Objective:   VS:    Vitals:   08/17/16 1940 08/18/16 0515 08/18/16 1900 08/19/16 0521  BP: 114/62 (!) 134/53 (!) 152/68 (!) 148/78  Pulse: 72 80 84 96  Resp: 18 18 20 20   Temp: 98.1 F (36.7 C) 98.2 F (36.8 C) 98.8 F (37.1 C) 98.7 F (37.1 C)  TempSrc: Oral Oral Oral Oral  SpO2: 99%     Weight:      Height:        No intake or output data in the 24 hours ending 08/19/16 1504      Recent Labs  08/17/16 0346 08/17/16 0521  WBC 8.9 10.0  HGB 9.4* 10.1*  HCT 27.4* 30.3*  PLT 171 196     Blood type: --/--/A POS (11/21 [id number])  Rubella: Nonimmune (05/10 0000)     Physical Exam:  General: alert, cooperative and no distress Abdomen: soft, nontender, normal bowel sounds Incision: clean, dry and intact Uterine Fundus: firm, below umbilicus, nontender Lochia: minimal Ext: edema +      Assessment/Plan: 40 y.o.   POD# 2. [id number]                  Principal Problem:   Postpartum care following cesarean delivery (11/22) Indication: Arrest of Dilation / 6 cm Active Problems:   Pregnancy diabetes   Gestational hypertension  - normotensive, no s/s of PEC  Doing well, stable.               Encourage rest when baby rests Breastfeeding support Encourage to ambulate Routine post-op care Anticipate DC in AM  Neta Mendsaniela C Paul, CNM, MSN 08/19/2016, 3:04 PM

## 2016-08-20 MED ORDER — IBUPROFEN 600 MG PO TABS: 600.0000 mg | ORAL_TABLET | Freq: Four times a day (QID) | ORAL | 0 refills | Status: DC

## 2016-08-20 MED ORDER — OXYCODONE HCL 5 MG PO TABS: 5.0000 mg | ORAL_TABLET | ORAL | 0 refills | Status: DC | PRN

## 2016-08-20 NOTE — Lactation Note (Signed)
This note was copied from a baby's chart. Lactation Consultation Note Mom and baby being d/c home today. Mom is breast and formula feeding. Mom states that she is formula/bottle feeding because her family wants to feed to the baby too. Mom has a DEBP at home. Discussed w/mom BF on one breast and pumping the other for storage/backup so family can feed the baby BM instead of formula. Mom stated she hadn't thought of that and she would try that since she had a pump. Baby sucking on a pacifier. Discussed discouraging pacifiers for 2 weeks to allow baby to demand the breast to build the supply of BM. Mom stated she like the pacifier that the baby wants to eat all the time. Discussed cluster feeding. Educated on engorgement, filling, management and treatment. Mom has WIC, has a f/u appt.all ready made.  Mom will be returning to work, plans to breast/bottle feed the baby until he gets teeth and bites. Mom made aware of O/P services, breastfeeding support groups, community resources, and our phone # for post-discharge questions.  Patient Name: Danielle Huerta NWGNF'AToday's Date: 08/20/2016 Reason for consult: Follow-up assessment   Maternal Data    Feeding Feeding Type: Bottle Fed - Formula Nipple Type: Slow - flow  LATCH Score/Interventions                      Lactation Tools Discussed/Used     Consult Status Consult Status: Complete Date: 08/20/16    Charyl DancerCARVER, Nasim Garofano G 08/20/2016, 9:09 AM

## 2016-08-20 NOTE — Discharge Summary (Signed)
OB Discharge Summary  Patient Name: Danielle Huerta DOB: [birth year] MRN: [id number]  Date of admission: 08/16/2016  Admitting diagnosis: INDUCTION for GDMa1 / AMA Intrauterine pregnancy: [redacted]w[redacted]d    Secondary diagnosis: labile hypertension   Date of discharge: 08/20/2016     Discharge diagnosis: Term Pregnancy Delivered, GDM A1 and POD 3 s/p cesarean section for arrest of dilation (6cm)      Prenatal history: G5P1040   EDC : 08/20/2016, by Last Menstrual Period  [facility]  Primary provider : Mody Prenatal course complicated by [facility]/ short cervix requiring rescue cerclage / GDMa1 / labile hypertension  Prenatal Labs: ABO, Rh: --/--/A POS (11/21 09480  Antibody: NEG (11/21 0823) Rubella: Nonimmune (05/10 0000)  / MMR booster given RPR: Non Reactive (11/21 0830)  HBsAg: Negative (05/10 0000)  HIV: Non-reactive (05/10 0000)  GBS: Negative (10/31 0000)                                    Hospital course:  Induction of Labor With Cesarean Section  40y.o. yo G5P1040 at 317w4das admitted to the hospital 08/16/2016 for induction of labor. Patient had a labor course significant for arrest of dilation at 6 cm for 3 hours. The patient went for cesarean section due to Arrest of Dilation, and delivered a Viable infant, Membrane Rupture Time/Date: 12:53 PM ,08/16/2016   Details of operation can be found in separate operative note.  Patient had an uncomplicated postpartum course. She is ambulating, tolerating a regular diet, passing flatus, and urinating well.  Patient is discharged home in stable condition on 08/20/16.                                   Delivering PROVIDER: FOAloha Gell                                                          Complications: None  Newborn Data: Live born female  Birth Weight: 7 lb 15.5 oz (3615 g) APGAR: 9, 9  Baby Feeding: Bottle and Breast Disposition:home with mother  Post partum procedures:MMR  vaccine  Postpartum contraception: Not Discussed    Labs: Lab Results  Component Value Date   WBC 10.0 08/17/2016   HGB 10.1 (L) 08/17/2016   HCT 30.3 (L) 08/17/2016   MCV 81.5 08/17/2016   PLT 196 08/17/2016   CMP Latest Ref Rng & Units 08/17/2016  Glucose 65 - 99 mg/dL 109(H)  BUN 6 - 20 mg/dL 6  Creatinine 0.44 - 1.00 mg/dL 0.88  Sodium 135 - 145 mmol/L 135  Potassium 3.5 - 5.1 mmol/L 4.0  Chloride 101 - 111 mmol/L 105  CO2 22 - 32 mmol/L 22  Calcium 8.9 - 10.3 mg/dL 8.7(L)  Total Protein 6.5 - 8.1 g/dL 6.4(L)  Total Bilirubin 0.3 - 1.2 mg/dL 0.9  Alkaline Phos 38 - 126 U/L 82  AST 15 - 41 U/L 35  ALT 14 - 54 U/L 16    Physical Exam @ time of discharge:  Vitals:   08/18/16 1900 08/19/16 0521 08/19/16 1742 08/20/16 0541  BP: (!Marland Kitchen  152/68 (!) 148/78 133/72 132/72  Pulse: 84 96 79 76  Resp: 20 20 20 18   Temp: 98.8 F (37.1 C) 98.7 F (37.1 C) 98.3 F (36.8 C) 98.1 F (36.7 C)  TempSrc: Oral Oral Oral Oral  SpO2:      Weight:      Height:        General: alert, cooperative and no distress Lochia: appropriate Uterine Fundus: firm Perineum: intact Incision: Healing well with no significant drainage Extremities: DVT Evaluation: No evidence of DVT seen on physical exam.   Discharge instructions:  "Baby and Me Booklet" and [facility] Booklet  Discharge Medications:    Medication List    TAKE these medications   acetaminophen 500 MG tablet Commonly known as:  TYLENOL Take 1 tablet (500 mg total) by mouth every 6 (six) hours as needed for moderate pain.   ibuprofen 600 MG tablet Commonly known as:  ADVIL,MOTRIN Take 1 tablet (600 mg total) by mouth every 6 (six) hours.   oxyCODONE 5 MG immediate release tablet Commonly known as:  Oxy IR/ROXICODONE Take 1 tablet (5 mg total) by mouth every 4 (four) hours as needed (pain scale 4-7).   prenatal multivitamin Tabs tablet Take 1 tablet by mouth daily at 12 noon.       Diet: routine diet  Activity:  Advance as tolerated. Pelvic rest x 6 weeks.   Follow up:6 weeks    Signed: Artelia Laroche CNM, MSN, [facility] 08/20/2016, 10:50 AM

## 2016-08-20 NOTE — Progress Notes (Signed)
POSTOPERATIVE DAY # 3 S/P arrest of dilation  S:         Reports feeling well             Tolerating po intake / no nausea / no vomiting / + flatus / + BM             Bleeding is light             Pain controlled with motrin and oxycodone             Up ad lib / ambulatory/ voiding QS  Newborn breast and bottle feeding     O:  VS: BP 132/72 (BP Location: Left Arm)   Pulse 76   Temp 98.1 F (36.7 C) (Oral)   Resp 18   Ht 5' 6"  (1.676 m)   Wt 114.8 kg (253 lb)   LMP 11/14/2015   SpO2 99%   Breastfeeding? Unknown   BMI 40.84 kg/m    LABS:              Bloodtype: --/--/A POS (11/21 3361)  Rubella: Nonimmune (05/10 0000) - given MMR                           Physical Exam:             Alert and Oriented X3  Lungs: Clear and unlabored  Heart: regular rate and rhythm / no mumurs  Abdomen: soft, non-tender, non-distended              Fundus: firm, non-tender, Ueven             Dressing intact              Incision:  approximated with suture / no erythema / no ecchymosis / no drainage  Perineum: intact  Lochia: light  Extremities: 1+ pedal edema, no calf pain or tenderness, negative Homans  A:        POD # 3 S/P CS - arrest of diilation            GDMa1 - delivered              P:        Routine postoperative care              DC home - WOB booklet - instructions reviewed               2hr GTT at 6-12 weeks screening for DM               BP recheck with 1 week   Artelia Laroche CNM, MSN, [facility] 08/20/2016, 10:44 AM

## 2016-11-30 ENCOUNTER — Ambulatory Visit (INDEPENDENT_AMBULATORY_CARE_PROVIDER_SITE_OTHER): Payer: BLUE CROSS/BLUE SHIELD | Admitting: Physician Assistant

## 2016-11-30 ENCOUNTER — Encounter (INDEPENDENT_AMBULATORY_CARE_PROVIDER_SITE_OTHER): Payer: Self-pay | Admitting: Physician Assistant

## 2016-11-30 ENCOUNTER — Other Ambulatory Visit (INDEPENDENT_AMBULATORY_CARE_PROVIDER_SITE_OTHER): Payer: BLUE CROSS/BLUE SHIELD

## 2016-11-30 VITALS — BP 142/84 | HR 83 | Temp 97.4°F | Ht 66.0 in | Wt 230.4 lb

## 2016-11-30 DIAGNOSIS — R030 Elevated blood-pressure reading, without diagnosis of hypertension: Secondary | ICD-10-CM | POA: Diagnosis not present

## 2016-11-30 DIAGNOSIS — M25462 Effusion, left knee: Secondary | ICD-10-CM | POA: Diagnosis not present

## 2016-11-30 MED ORDER — IBUPROFEN 600 MG PO TABS: 600.0000 mg | ORAL_TABLET | Freq: Three times a day (TID) | ORAL | 0 refills | Status: AC | PRN

## 2016-11-30 MED ORDER — HYDROCHLOROTHIAZIDE 12.5 MG PO TABS: 12.5000 mg | ORAL_TABLET | Freq: Every day | ORAL | 3 refills | Status: DC

## 2016-11-30 MED ORDER — CLONIDINE HCL 0.1 MG PO TABS
0.1000 mg | ORAL_TABLET | Freq: Once | ORAL | Status: AC
  Administered 2016-11-30: 0.1 mg via ORAL

## 2016-11-30 NOTE — Patient Instructions (Addendum)
Hypertension Hypertension, commonly called high blood pressure, is when the force of blood pumping through the arteries is too strong. The arteries are the blood vessels that carry blood from the heart throughout the body. Hypertension forces the heart to work harder to pump blood and may cause arteries to become narrow or stiff. Having untreated or uncontrolled hypertension can cause heart attacks, strokes, kidney disease, and other problems. A blood pressure reading consists of a higher number over a lower number. Ideally, your blood pressure should be below 120/80. The first ("top") number is called the systolic pressure. It is a measure of the pressure in your arteries as your heart beats. The second ("bottom") number is called the diastolic pressure. It is a measure of the pressure in your arteries as the heart relaxes. What are the causes? The cause of this condition is not known. What increases the risk? Some risk factors for high blood pressure are under your control. Others are not. Factors you can change   Smoking.  Having type 2 diabetes mellitus, high cholesterol, or both.  Not getting enough exercise or physical activity.  Being overweight.  Having too much fat, sugar, calories, or salt (sodium) in your diet.  Drinking too much alcohol. Factors that are difficult or impossible to change   Having chronic kidney disease.  Having a family history of high blood pressure.  Age. Risk increases with age.  Race. You may be at higher risk if you are African-American.  Gender. Men are at higher risk than women before age 45. After age 65, women are at higher risk than men.  Having obstructive sleep apnea.  Stress. What are the signs or symptoms? Extremely high blood pressure (hypertensive crisis) may cause:  Headache.  Anxiety.  Shortness of breath.  Nosebleed.  Nausea and vomiting.  Severe chest pain.  Jerky movements you cannot control (seizures). How is this  diagnosed? This condition is diagnosed by measuring your blood pressure while you are seated, with your arm resting on a surface. The cuff of the blood pressure monitor will be placed directly against the skin of your upper arm at the level of your heart. It should be measured at least twice using the same arm. Certain conditions can cause a difference in blood pressure between your right and left arms. Certain factors can cause blood pressure readings to be lower or higher than normal (elevated) for a short period of time:  When your blood pressure is higher when you are in a health care provider's office than when you are at home, this is called white coat hypertension. Most people with this condition do not need medicines.  When your blood pressure is higher at home than when you are in a health care provider's office, this is called masked hypertension. Most people with this condition may need medicines to control blood pressure. If you have a high blood pressure reading during one visit or you have normal blood pressure with other risk factors:  You may be asked to return on a different day to have your blood pressure checked again.  You may be asked to monitor your blood pressure at home for 1 week or longer. If you are diagnosed with hypertension, you may have other blood or imaging tests to help your health care provider understand your overall risk for other conditions. How is this treated? This condition is treated by making healthy lifestyle changes, such as eating healthy foods, exercising more, and reducing your alcohol intake. Your health   care provider may prescribe medicine if lifestyle changes are not enough to get your blood pressure under control, and if:  Your systolic blood pressure is above 130.  Your diastolic blood pressure is above 80. Your personal target blood pressure may vary depending on your medical conditions, your age, and other factors. Follow these instructions  at home: Eating and drinking   Eat a diet that is high in fiber and potassium, and low in sodium, added sugar, and fat. An example eating plan is called the DASH (Dietary Approaches to Stop Hypertension) diet. To eat this way:  Eat plenty of fresh fruits and vegetables. Try to fill half of your plate at each meal with fruits and vegetables.  Eat whole grains, such as whole wheat pasta, brown rice, or whole grain bread. Fill about one quarter of your plate with whole grains.  Eat or drink low-fat dairy products, such as skim milk or low-fat yogurt.  Avoid fatty cuts of meat, processed or cured meats, and poultry with skin. Fill about one quarter of your plate with lean proteins, such as fish, chicken without skin, beans, eggs, and tofu.  Avoid premade and processed foods. These tend to be higher in sodium, added sugar, and fat.  Reduce your daily sodium intake. Most people with hypertension should eat less than 1,500 mg of sodium a day.  Limit alcohol intake to no more than 1 drink a day for nonpregnant women and 2 drinks a day for men. One drink equals 12 oz of beer, 5 oz of wine, or 1 oz of hard liquor. Lifestyle   Work with your health care provider to maintain a healthy body weight or to lose weight. Ask what an ideal weight is for you.  Get at least 30 minutes of exercise that causes your heart to beat faster (aerobic exercise) most days of the week. Activities may include walking, swimming, or biking.  Include exercise to strengthen your muscles (resistance exercise), such as pilates or lifting weights, as part of your weekly exercise routine. Try to do these types of exercises for 30 minutes at least 3 days a week.  Do not use any products that contain nicotine or tobacco, such as cigarettes and e-cigarettes. If you need help quitting, ask your health care provider.  Monitor your blood pressure at home as told by your health care provider.  Keep all follow-up visits as told by  your health care provider. This is important. Medicines   Take over-the-counter and prescription medicines only as told by your health care provider. Follow directions carefully. Blood pressure medicines must be taken as prescribed.  Do not skip doses of blood pressure medicine. Doing this puts you at risk for problems and can make the medicine less effective.  Ask your health care provider about side effects or reactions to medicines that you should watch for. Contact a health care provider if:  You think you are having a reaction to a medicine you are taking.  You have headaches that keep coming back (recurring).  You feel dizzy.  You have swelling in your ankles.  You have trouble with your vision. Get help right away if:  You develop a severe headache or confusion.  You have unusual weakness or numbness.  You feel faint.  You have severe pain in your chest or abdomen.  You vomit repeatedly.  You have trouble breathing. Summary  Hypertension is when the force of blood pumping through your arteries is too strong. If this condition is   not controlled, it may put you at risk for serious complications.  Your personal target blood pressure may vary depending on your medical conditions, your age, and other factors. For most people, a normal blood pressure is less than 120/80.  Hypertension is treated with lifestyle changes, medicines, or a combination of both. Lifestyle changes include weight loss, eating a healthy, low-sodium diet, exercising more, and limiting alcohol. This information is not intended to replace advice given to you by your health care provider. Make sure you discuss any questions you have with your health care provider. Document Released: 09/12/2005 Document Revised: 08/10/2016 Document Reviewed: 08/10/2016 Elsevier Interactive Patient Education  2017 [employer]  DASH Eating Plan DASH stands for "Dietary Approaches to Stop Hypertension." The DASH eating  plan is a healthy eating plan that has been shown to reduce high blood pressure (hypertension). It may also reduce your risk for type 2 diabetes, heart disease, and stroke. The DASH eating plan may also help with weight loss. What are tips for following this plan? General guidelines   Avoid eating more than 2,300 mg (milligrams) of salt (sodium) a day. If you have hypertension, you may need to reduce your sodium intake to 1,500 mg a day.  Limit alcohol intake to no more than 1 drink a day for nonpregnant women and 2 drinks a day for men. One drink equals 12 oz of beer, 5 oz of wine, or 1 oz of hard liquor.  Work with your health care provider to maintain a healthy body weight or to lose weight. Ask what an ideal weight is for you.  Get at least 30 minutes of exercise that causes your heart to beat faster (aerobic exercise) most days of the week. Activities may include walking, swimming, or biking.  Work with your health care provider or diet and nutrition specialist (dietitian) to adjust your eating plan to your individual calorie needs. Reading food labels   Check food labels for the amount of sodium per serving. Choose foods with less than 5 percent of the Daily Value of sodium. Generally, foods with less than 300 mg of sodium per serving fit into this eating plan.  To find whole grains, look for the word "whole" as the first word in the ingredient list. Shopping   Buy products labeled as "low-sodium" or "no salt added."  Buy fresh foods. Avoid canned foods and premade or frozen meals. Cooking   Avoid adding salt when cooking. Use salt-free seasonings or herbs instead of table salt or sea salt. Check with your health care provider or pharmacist before using salt substitutes.  Do not fry foods. Cook foods using healthy methods such as baking, boiling, grilling, and broiling instead.  Cook with heart-healthy oils, such as olive, canola, soybean, or sunflower oil. Meal planning     Eat a balanced diet that includes:  5 or more servings of fruits and vegetables each day. At each meal, try to fill half of your plate with fruits and vegetables.  Up to 6-8 servings of whole grains each day.  Less than 6 oz of lean meat, poultry, or fish each day. A 3-oz serving of meat is about the same size as a deck of cards. One egg equals 1 oz.  2 servings of low-fat dairy each day.  A serving of nuts, seeds, or beans 5 times each week.  Heart-healthy fats. Healthy fats called Omega-3 fatty acids are found in foods such as flaxseeds and coldwater fish, like sardines, salmon, and mackerel.    Limit how much you eat of the following:  Canned or prepackaged foods.  Food that is high in trans fat, such as fried foods.  Food that is high in saturated fat, such as fatty meat.  Sweets, desserts, sugary drinks, and other foods with added sugar.  Full-fat dairy products.  Do not salt foods before eating.  Try to eat at least 2 vegetarian meals each week.  Eat more home-cooked food and less restaurant, buffet, and fast food.  When eating at a restaurant, ask that your food be prepared with less salt or no salt, if possible. What foods are recommended? The items listed may not be a complete list. Talk with your dietitian about what dietary choices are best for you. Grains  Whole-grain or whole-wheat bread. Whole-grain or whole-wheat pasta. Brown rice. Oatmeal. Quinoa. Bulgur. Whole-grain and low-sodium cereals. Pita bread. Low-fat, low-sodium crackers. Whole-wheat flour tortillas. Vegetables  Fresh or frozen vegetables (raw, steamed, roasted, or grilled). Low-sodium or reduced-sodium tomato and vegetable juice. Low-sodium or reduced-sodium tomato sauce and tomato paste. Low-sodium or reduced-sodium canned vegetables. Fruits  All fresh, dried, or frozen fruit. Canned fruit in natural juice (without added sugar). Meat and other protein foods  Skinless chicken or turkey. Ground  chicken or turkey. Pork with fat trimmed off. Fish and seafood. Egg whites. Dried beans, peas, or lentils. Unsalted nuts, nut butters, and seeds. Unsalted canned beans. Lean cuts of beef with fat trimmed off. Low-sodium, lean deli meat. Dairy  Low-fat (1%) or fat-free (skim) milk. Fat-free, low-fat, or reduced-fat cheeses. Nonfat, low-sodium ricotta or cottage cheese. Low-fat or nonfat yogurt. Low-fat, low-sodium cheese. Fats and oils  Soft margarine without trans fats. Vegetable oil. Low-fat, reduced-fat, or light mayonnaise and salad dressings (reduced-sodium). Canola, safflower, olive, soybean, and sunflower oils. Avocado. Seasoning and other foods  Herbs. Spices. Seasoning mixes without salt. Unsalted popcorn and pretzels. Fat-free sweets. What foods are not recommended? The items listed may not be a complete list. Talk with your dietitian about what dietary choices are best for you. Grains  Baked goods made with fat, such as croissants, muffins, or some breads. Dry pasta or rice meal packs. Vegetables  Creamed or fried vegetables. Vegetables in a cheese sauce. Regular canned vegetables (not low-sodium or reduced-sodium). Regular canned tomato sauce and paste (not low-sodium or reduced-sodium). Regular tomato and vegetable juice (not low-sodium or reduced-sodium). Pickles. Olives. Fruits  Canned fruit in a light or heavy syrup. Fried fruit. Fruit in cream or butter sauce. Meat and other protein foods  Fatty cuts of meat. Ribs. Fried meat. Bacon. Sausage. Bologna and other processed lunch meats. Salami. Fatback. Hotdogs. Bratwurst. Salted nuts and seeds. Canned beans with added salt. Canned or smoked fish. Whole eggs or egg yolks. Chicken or turkey with skin. Dairy  Whole or 2% milk, cream, and half-and-half. Whole or full-fat cream cheese. Whole-fat or sweetened yogurt. Full-fat cheese. Nondairy creamers. Whipped toppings. Processed cheese and cheese spreads. Fats and oils  Butter. Stick  margarine. Lard. Shortening. Ghee. Bacon fat. Tropical oils, such as coconut, palm kernel, or palm oil. Seasoning and other foods  Salted popcorn and pretzels. Onion salt, garlic salt, seasoned salt, table salt, and sea salt. Worcestershire sauce. Tartar sauce. Barbecue sauce. Teriyaki sauce. Soy sauce, including reduced-sodium. Steak sauce. Canned and packaged gravies. Fish sauce. Oyster sauce. Cocktail sauce. Horseradish that you find on the shelf. Ketchup. Mustard. Meat flavorings and tenderizers. Bouillon cubes. Hot sauce and Tabasco sauce. Premade or packaged marinades. Premade or packaged taco seasonings. Relishes.   Regular salad dressings. Where to find more information:  National Heart, Lung, and Blood Institute: www.nhlbi.nih.gov  American Heart Association: www.heart.org Summary  The DASH eating plan is a healthy eating plan that has been shown to reduce high blood pressure (hypertension). It may also reduce your risk for type 2 diabetes, heart disease, and stroke.  With the DASH eating plan, you should limit salt (sodium) intake to 2,300 mg a day. If you have hypertension, you may need to reduce your sodium intake to 1,500 mg a day.  When on the DASH eating plan, aim to eat more fresh fruits and vegetables, whole grains, lean proteins, low-fat dairy, and heart-healthy fats.  Work with your health care provider or diet and nutrition specialist (dietitian) to adjust your eating plan to your individual calorie needs. This information is not intended to replace advice given to you by your health care provider. Make sure you discuss any questions you have with your health care provider. Document Released: 09/01/2011 Document Revised: 09/05/2016 Document Reviewed: 09/05/2016 Elsevier Interactive Patient Education  2017 [employer] Inc.  

## 2016-11-30 NOTE — Progress Notes (Signed)
Subjective:  Patient ID: Danielle Huerta, female    DOB: 1976/04/03  Age: 41 y.o. MRN: [id number]  CC:  Left knee pain   HPI Danielle Huerta is a 41 y.o. female with a PMH of PCO S and obesity presents to establish care. States that her left knee is hurting. Worse with walking and standing. Better with rest. Has seen an orthopedic specialist previously. Does not know what she was diagnosed with. Is using a knee brace. Is taking Tylenol with little relief. She is also found to be hypertensive in clinic today, 160/111. Denies any history of hypertension. Denies chest pain, palpitations, headache, shortness of breath, abdominal pain, fever, chills, nausea, vomiting, rash, or GI/GU symptoms.      Outpatient Medications Prior to Visit  Medication Sig Dispense Refill  . acetaminophen (TYLENOL) 500 MG tablet Take 1 tablet (500 mg total) by mouth every 6 (six) hours as needed for moderate pain. 30 tablet 0  . ibuprofen (ADVIL,MOTRIN) 600 MG tablet Take 1 tablet (600 mg total) by mouth every 6 (six) hours. 30 tablet 0  . oxyCODONE (OXY IR/ROXICODONE) 5 MG immediate release tablet Take 1 tablet (5 mg total) by mouth every 4 (four) hours as needed (pain scale 4-7). (Patient not taking: Reported on 11/30/2016) 30 tablet 0  . Prenatal Vit-Fe Fumarate-FA (PRENATAL MULTIVITAMIN) TABS tablet Take 1 tablet by mouth daily at 12 noon.     No facility-administered medications prior to visit.      ROS Review of Systems  Constitutional: Negative for chills, fever and malaise/fatigue.  Eyes: Negative for blurred vision.  Respiratory: Negative for shortness of breath.   Cardiovascular: Negative for chest pain and palpitations.  Gastrointestinal: Negative for abdominal pain and nausea.  Genitourinary: Negative for dysuria and hematuria.  Musculoskeletal: Positive for joint pain (left knee). Negative for myalgias.  Skin: Negative for rash.  Neurological: Negative for tingling and headaches.   Psychiatric/Behavioral: Negative for depression. The patient is not nervous/anxious.     Objective:  BP (!) 158/118   Pulse 83   Temp 97.4 F (36.3 C) (Oral)   Ht 5\' 6"  (1.676 m)   Wt 230 lb 6.4 oz (104.5 kg)   SpO2 97%   Breastfeeding? Yes   BMI 37.19 kg/m   BP/Weight 11/30/2016 08/20/2016 08/16/2016  Systolic BP 158 132 -  Diastolic BP 118 72 -  Wt. (Lbs) 230.4 - 253  BMI 37.19 - 40.84      Physical Exam  Constitutional: She is oriented to person, place, and time.  Obese, NAD, polite  HENT:  Head: Normocephalic and atraumatic.  Eyes: No scleral icterus.  Neck: Normal range of motion. Neck supple. No thyromegaly present.  Cardiovascular: Normal rate, regular rhythm and normal heart sounds.   Pulmonary/Chest: Effort normal and breath sounds normal.  Abdominal: Soft. Bowel sounds are normal. There is no tenderness.  Musculoskeletal: Normal range of motion. She exhibits edema (Mild effusion of the medial aspect of left knee. No increased warmth, overlying erythema, or ecchymosis.) and tenderness (mild TTP over the medial aspect of left knee).  Neurological: She is alert and oriented to person, place, and time.  Skin: Skin is warm and dry. No rash noted. No erythema. No pallor.  Psychiatric: She has a normal mood and affect. Her behavior is normal. Thought content normal.  Vitals reviewed.    Assessment & Plan:   1. Elevated Blood Pressure without diagnosis of HTN - HCTZ 12.5mg  - cloNIDine (CATAPRES) tablet 0.1 mg; Take 1 tablet (0.1  mg total) by mouth once. - Return in one week for HTN f/u.   2. Effusion of left knee - Small effusion. Take NSAID. Advised to return here or go back to her orthopedic specialist if effusion or pain become worse.   Meds ordered this encounter  Medications  . hydrochlorothiazide (HYDRODIURIL) 12.5 MG tablet    Sig: Take 1 tablet (12.5 mg total) by mouth daily.    Dispense:  90 tablet    Refill:  3    Order Specific Question:    Supervising Provider    Answer:   Quentin AngstJEGEDE, OLUGBEMIGA E [id number]  . cloNIDine (CATAPRES) tablet 0.1 mg  . ibuprofen (ADVIL,MOTRIN) 600 MG tablet    Sig: Take 1 tablet (600 mg total) by mouth every 8 (eight) hours as needed.    Dispense:  30 tablet    Refill:  0    Order Specific Question:   Supervising Provider    Answer:   Quentin AngstJEGEDE, OLUGBEMIGA E [[id number]]    Follow-up: 2 days  Loletta Specteroger David Jessiah Wojnar PA

## 2016-12-02 ENCOUNTER — Other Ambulatory Visit (INDEPENDENT_AMBULATORY_CARE_PROVIDER_SITE_OTHER): Payer: BLUE CROSS/BLUE SHIELD

## 2016-12-02 VITALS — BP 141/90 | HR 88

## 2016-12-02 DIAGNOSIS — I10 Essential (primary) hypertension: Secondary | ICD-10-CM

## 2016-12-02 MED ORDER — HYDROCHLOROTHIAZIDE 12.5 MG PO TABS: 25.0000 mg | ORAL_TABLET | Freq: Every day | ORAL | 3 refills | Status: DC

## 2016-12-02 NOTE — Patient Instructions (Signed)
DASH Eating Plan DASH stands for "Dietary Approaches to Stop Hypertension." The DASH eating plan is a healthy eating plan that has been shown to reduce high blood pressure (hypertension). It may also reduce your risk for type 2 diabetes, heart disease, and stroke. The DASH eating plan may also help with weight loss. What are tips for following this plan? General guidelines  Avoid eating more than 2,300 mg (milligrams) of salt (sodium) a day. If you have hypertension, you may need to reduce your sodium intake to 1,500 mg a day.  Limit alcohol intake to no more than 1 drink a day for nonpregnant women and 2 drinks a day for men. One drink equals 12 oz of beer, 5 oz of wine, or 1 oz of hard liquor.  Work with your health care provider to maintain a healthy body weight or to lose weight. Ask what an ideal weight is for you.  Get at least 30 minutes of exercise that causes your heart to beat faster (aerobic exercise) most days of the week. Activities may include walking, swimming, or biking.  Work with your health care provider or diet and nutrition specialist (dietitian) to adjust your eating plan to your individual calorie needs. Reading food labels  Check food labels for the amount of sodium per serving. Choose foods with less than 5 percent of the Daily Value of sodium. Generally, foods with less than 300 mg of sodium per serving fit into this eating plan.  To find whole grains, look for the word "whole" as the first word in the ingredient list. Shopping  Buy products labeled as "low-sodium" or "no salt added."  Buy fresh foods. Avoid canned foods and premade or frozen meals. Cooking  Avoid adding salt when cooking. Use salt-free seasonings or herbs instead of table salt or sea salt. Check with your health care provider or pharmacist before using salt substitutes.  Do not fry foods. Cook foods using healthy methods such as baking, boiling, grilling, and broiling instead.  Cook with  heart-healthy oils, such as olive, canola, soybean, or sunflower oil. Meal planning   Eat a balanced diet that includes: ? 5 or more servings of fruits and vegetables each day. At each meal, try to fill half of your plate with fruits and vegetables. ? Up to 6-8 servings of whole grains each day. ? Less than 6 oz of lean meat, poultry, or fish each day. A 3-oz serving of meat is about the same size as a deck of cards. One egg equals 1 oz. ? 2 servings of low-fat dairy each day. ? A serving of nuts, seeds, or beans 5 times each week. ? Heart-healthy fats. Healthy fats called Omega-3 fatty acids are found in foods such as flaxseeds and coldwater fish, like sardines, salmon, and mackerel.  Limit how much you eat of the following: ? Canned or prepackaged foods. ? Food that is high in trans fat, such as fried foods. ? Food that is high in saturated fat, such as fatty meat. ? Sweets, desserts, sugary drinks, and other foods with added sugar. ? Full-fat dairy products.  Do not salt foods before eating.  Try to eat at least 2 vegetarian meals each week.  Eat more home-cooked food and less restaurant, buffet, and fast food.  When eating at a restaurant, ask that your food be prepared with less salt or no salt, if possible. What foods are recommended? The items listed may not be a complete list. Talk with your dietitian about what   dietary choices are best for you. Grains Whole-grain or whole-wheat bread. Whole-grain or whole-wheat pasta. Brown rice. Oatmeal. Quinoa. Bulgur. Whole-grain and low-sodium cereals. Pita bread. Low-fat, low-sodium crackers. Whole-wheat flour tortillas. Vegetables Fresh or frozen vegetables (raw, steamed, roasted, or grilled). Low-sodium or reduced-sodium tomato and vegetable juice. Low-sodium or reduced-sodium tomato sauce and tomato paste. Low-sodium or reduced-sodium canned vegetables. Fruits All fresh, dried, or frozen fruit. Canned fruit in natural juice (without  added sugar). Meat and other protein foods Skinless chicken or turkey. Ground chicken or turkey. Pork with fat trimmed off. Fish and seafood. Egg whites. Dried beans, peas, or lentils. Unsalted nuts, nut butters, and seeds. Unsalted canned beans. Lean cuts of beef with fat trimmed off. Low-sodium, lean deli meat. Dairy Low-fat (1%) or fat-free (skim) milk. Fat-free, low-fat, or reduced-fat cheeses. Nonfat, low-sodium ricotta or cottage cheese. Low-fat or nonfat yogurt. Low-fat, low-sodium cheese. Fats and oils Soft margarine without trans fats. Vegetable oil. Low-fat, reduced-fat, or light mayonnaise and salad dressings (reduced-sodium). Canola, safflower, olive, soybean, and sunflower oils. Avocado. Seasoning and other foods Herbs. Spices. Seasoning mixes without salt. Unsalted popcorn and pretzels. Fat-free sweets. What foods are not recommended? The items listed may not be a complete list. Talk with your dietitian about what dietary choices are best for you. Grains Baked goods made with fat, such as croissants, muffins, or some breads. Dry pasta or rice meal packs. Vegetables Creamed or fried vegetables. Vegetables in a cheese sauce. Regular canned vegetables (not low-sodium or reduced-sodium). Regular canned tomato sauce and paste (not low-sodium or reduced-sodium). Regular tomato and vegetable juice (not low-sodium or reduced-sodium). Pickles. Olives. Fruits Canned fruit in a light or heavy syrup. Fried fruit. Fruit in cream or butter sauce. Meat and other protein foods Fatty cuts of meat. Ribs. Fried meat. Bacon. Sausage. Bologna and other processed lunch meats. Salami. Fatback. Hotdogs. Bratwurst. Salted nuts and seeds. Canned beans with added salt. Canned or smoked fish. Whole eggs or egg yolks. Chicken or turkey with skin. Dairy Whole or 2% milk, cream, and half-and-half. Whole or full-fat cream cheese. Whole-fat or sweetened yogurt. Full-fat cheese. Nondairy creamers. Whipped toppings.  Processed cheese and cheese spreads. Fats and oils Butter. Stick margarine. Lard. Shortening. Ghee. Bacon fat. Tropical oils, such as coconut, palm kernel, or palm oil. Seasoning and other foods Salted popcorn and pretzels. Onion salt, garlic salt, seasoned salt, table salt, and sea salt. Worcestershire sauce. Tartar sauce. Barbecue sauce. Teriyaki sauce. Soy sauce, including reduced-sodium. Steak sauce. Canned and packaged gravies. Fish sauce. Oyster sauce. Cocktail sauce. Horseradish that you find on the shelf. Ketchup. Mustard. Meat flavorings and tenderizers. Bouillon cubes. Hot sauce and Tabasco sauce. Premade or packaged marinades. Premade or packaged taco seasonings. Relishes. Regular salad dressings. Where to find more information:  National Heart, Lung, and Blood Institute: www.nhlbi.nih.gov  American Heart Association: www.heart.org Summary  The DASH eating plan is a healthy eating plan that has been shown to reduce high blood pressure (hypertension). It may also reduce your risk for type 2 diabetes, heart disease, and stroke.  With the DASH eating plan, you should limit salt (sodium) intake to 2,300 mg a day. If you have hypertension, you may need to reduce your sodium intake to 1,500 mg a day.  When on the DASH eating plan, aim to eat more fresh fruits and vegetables, whole grains, lean proteins, low-fat dairy, and heart-healthy fats.  Work with your health care provider or diet and nutrition specialist (dietitian) to adjust your eating plan to your individual   calorie needs. This information is not intended to replace advice given to you by your health care provider. Make sure you discuss any questions you have with your health care provider. Document Released: 09/01/2011 Document Revised: 09/05/2016 Document Reviewed: 09/05/2016 Elsevier Interactive Patient Education  2017 Elsevier Inc.  

## 2016-12-02 NOTE — Progress Notes (Signed)
Pt counsel[facility]

## 2016-12-29 ENCOUNTER — Ambulatory Visit (INDEPENDENT_AMBULATORY_CARE_PROVIDER_SITE_OTHER): Payer: BLUE CROSS/BLUE SHIELD | Admitting: Physician Assistant

## 2016-12-29 ENCOUNTER — Encounter (INDEPENDENT_AMBULATORY_CARE_PROVIDER_SITE_OTHER): Payer: Self-pay | Admitting: Physician Assistant

## 2016-12-29 VITALS — BP 145/92 | HR 87 | Temp 97.8°F | Ht 66.0 in | Wt 232.8 lb

## 2016-12-29 DIAGNOSIS — J0110 Acute frontal sinusitis, unspecified: Secondary | ICD-10-CM

## 2016-12-29 DIAGNOSIS — I10 Essential (primary) hypertension: Secondary | ICD-10-CM

## 2016-12-29 MED ORDER — LISINOPRIL-HYDROCHLOROTHIAZIDE 20-25 MG PO TABS: 1.0000 | ORAL_TABLET | Freq: Every day | ORAL | 3 refills | Status: DC

## 2016-12-29 MED ORDER — SALINE SPRAY 0.65 % NA SOLN: 1.0000 | NASAL | 0 refills | Status: DC | PRN

## 2016-12-29 MED ORDER — AZITHROMYCIN 250 MG PO TABS: ORAL_TABLET | ORAL | 0 refills | Status: AC

## 2016-12-29 MED ORDER — DM-APAP-CPM 15-500-2 MG PO TABS
2.0000 | ORAL_TABLET | Freq: Four times a day (QID) | ORAL | 0 refills | Status: AC
Start: 2016-12-29 — End: 2017-01-03

## 2016-12-29 NOTE — Progress Notes (Signed)
Pt presents for a HTN  F/U Denies any pain today  Has complaints about cold like symptoms  Productive cough

## 2016-12-29 NOTE — Patient Instructions (Signed)

## 2016-12-29 NOTE — Progress Notes (Signed)
Subjective:  Patient ID: Danielle Huerta, female    DOB: September 21, 1976  Age: 41 y.o. MRN: [id number]  CC: cold  HPI Danielle Huerta is a 41 y.o. female with a PMH of PCOS and HTN presents with cold like symptoms and HTN f/u. Has been with nasal congestion, cough, ear fullness bilaterally, mildly sore throat, and frontal sinus pressure for approximately 1-2 weeks. Says her children are displaying similar symptoms. Denies fever, chills, nausea, vomiting, rash, CP, SOB, generalized HA, abdominal pain, diarrhea, or GU sxs.    In regards to HTN, she is taking HCTZ 25 mg qday. No cardiovascular symptoms, pulmonary symptoms, or constitutional symptoms to report.     Outpatient Medications Prior to Visit  Medication Sig Dispense Refill  . acetaminophen (TYLENOL) 500 MG tablet Take 1 tablet (500 mg total) by mouth every 6 (six) hours as needed for moderate pain. 30 tablet 0  . ibuprofen (ADVIL,MOTRIN) 600 MG tablet Take 1 tablet (600 mg total) by mouth every 8 (eight) hours as needed. 30 tablet 0  . hydrochlorothiazide (HYDRODIURIL) 12.5 MG tablet Take 2 tablets (25 mg total) by mouth daily. 90 tablet 3   No facility-administered medications prior to visit.      ROS Review of Systems  Constitutional: Negative for chills, fever and malaise/fatigue.  HENT: Positive for congestion, ear pain (ear fullness bilaterally), sinus pain and sore throat.   Eyes: Negative for blurred vision.  Respiratory: Positive for cough. Negative for shortness of breath.   Cardiovascular: Negative for chest pain and palpitations.  Gastrointestinal: Negative for abdominal pain and nausea.  Genitourinary: Negative for dysuria and hematuria.  Musculoskeletal: Negative for joint pain and myalgias.  Skin: Negative for rash.  Neurological: Negative for tingling and headaches.  Psychiatric/Behavioral: Negative for depression. The patient is not nervous/anxious.     Objective:  BP (!) 145/92 (BP Location: Left Arm, Patient  Position: Sitting, Cuff Size: Large)   Pulse 87   Temp 97.8 F (36.6 C) (Oral)   Ht  (1.676 m)   Wt 232 lb 12.8 oz (105.6 kg)   SpO2 97%   Breastfeeding? No   BMI 37.57 kg/m   BP/Weight 12/29/2016 12/02/2016 11/30/2016  Systolic BP 145 141 142  Diastolic BP 92 90 84  Wt. (Lbs) 232.8 - 230.4  BMI 37.57 - 37.19      Physical Exam  Constitutional: She is oriented to person, place, and time.  Well developed, obese, NAD  HENT:  Head: Normocephalic and atraumatic.  Turbinates erythematous and moderately hypertrophic. TMs erythematous with mild bulging bilaterally. Oropharynx injected. Tonsils 1+ with no exudates. Mild frontal sinus TTP. No maxillary sinus TTP.  Eyes: No scleral icterus.  Neck: Normal range of motion. Neck supple.  Cardiovascular: Normal rate, regular rhythm and normal heart sounds.   Pulmonary/Chest: Effort normal and breath sounds normal.  Musculoskeletal: She exhibits no edema.  Lymphadenopathy:    She has no cervical adenopathy.  Neurological: She is alert and oriented to person, place, and time.  Skin: Skin is warm and dry. No rash noted. No erythema. No pallor.  Psychiatric: She has a normal mood and affect. Her behavior is normal. Thought content normal.  Vitals reviewed.    Assessment & Plan:   1. Acute non-recurrent frontal sinusitis - DM-APAP-CPM (CORICIDIN HBP FLU) 15-500-2 MG TABS; Take 2 tablets by mouth every 6 (six) hours.  Dispense: 1 each; Refill: 0 - azithromycin (ZITHROMAX) 250 MG tablet; Take two tablets on first day then take one tablet daily everyday  thereafter until completion  Dispense: 6 tablet; Refill: 0 - sodium chloride (OCEAN) 0.65 % SOLN nasal spray; Place 1 spray into both nostrils as needed for congestion.  Dispense: 1 Bottle; Refill: 0  2. Hypertension, unspecified type - Last BP in clinic 158/118, currently on HCTZ 25 mg qa.m., BP in clinic today 145/92. Will need to add on anti-hypertensive. - lisinopril-hydrochlorothiazide  (PRINZIDE,ZESTORETIC) 20-25 MG tablet; Take 1 tablet by mouth daily.  Dispense: 30 tablet; Refill: 3   Meds ordered this encounter  Medications  . lisinopril-hydrochlorothiazide (PRINZIDE,ZESTORETIC) 20-25 MG tablet    Sig: Take 1 tablet by mouth daily.    Dispense:  30 tablet    Refill:  3    Order Specific Question:   Supervising Provider    Answer:   Quentin Angst [id number]  . DM-APAP-CPM (CORICIDIN HBP FLU) 15-500-2 MG TABS    Sig: Take 2 tablets by mouth every 6 (six) hours.    Dispense:  1 each    Refill:  0    Order Specific Question:   Supervising Provider    Answer:   Quentin Angst [id number]  . azithromycin (ZITHROMAX) 250 MG tablet    Sig: Take two tablets on first day then take one tablet daily everyday thereafter until completion    Dispense:  6 tablet    Refill:  0    Order Specific Question:   Supervising Provider    Answer:   Quentin Angst [id number]  . sodium chloride (OCEAN) 0.65 % SOLN nasal spray    Sig: Place 1 spray into both nostrils as needed for congestion.    Dispense:  1 Bottle    Refill:  0    Order Specific Question:   Supervising Provider    Answer:   Quentin Angst [id number]    Follow-up: Return in about 4 weeks (around 01/26/2017) for full physical, htn.   Loletta Specter PA

## 2017-01-26 ENCOUNTER — Ambulatory Visit (INDEPENDENT_AMBULATORY_CARE_PROVIDER_SITE_OTHER): Payer: BLUE CROSS/BLUE SHIELD | Admitting: Physician Assistant

## 2017-02-01 ENCOUNTER — Encounter (INDEPENDENT_AMBULATORY_CARE_PROVIDER_SITE_OTHER): Payer: Self-pay | Admitting: Physician Assistant

## 2017-02-01 ENCOUNTER — Ambulatory Visit (INDEPENDENT_AMBULATORY_CARE_PROVIDER_SITE_OTHER): Payer: BLUE CROSS/BLUE SHIELD | Admitting: Physician Assistant

## 2017-02-01 VITALS — BP 117/74 | HR 85 | Temp 97.8°F | Wt 228.8 lb

## 2017-02-01 DIAGNOSIS — Z23 Encounter for immunization: Secondary | ICD-10-CM

## 2017-02-01 DIAGNOSIS — Z0000 Encounter for general adult medical examination without abnormal findings: Secondary | ICD-10-CM

## 2017-02-01 DIAGNOSIS — E119 Type 2 diabetes mellitus without complications: Secondary | ICD-10-CM

## 2017-02-01 LAB — POCT URINALYSIS DIPSTICK
BILIRUBIN UA: NEGATIVE
Glucose, UA: NEGATIVE
KETONES UA: NEGATIVE
LEUKOCYTES UA: NEGATIVE
Nitrite, UA: NEGATIVE
PH UA: 7 (ref 5.0–8.0)
Protein, UA: NEGATIVE
RBC UA: NEGATIVE
Spec Grav, UA: 1.015 (ref 1.010–1.025)
Urobilinogen, UA: 0.2 E.U./dL

## 2017-02-01 LAB — POCT GLYCOSYLATED HEMOGLOBIN (HGB A1C): Hemoglobin A1C: 6.3

## 2017-02-01 NOTE — Patient Instructions (Signed)
Diabetic Retinopathy Diabetic retinopathy is a disease of the light-sensitive membrane at the back of the eye (retina). It is a complication of diabetes and a common cause of blindness. Early detection of the disease is key to keeping your eyes healthy. What are the causes? Diabetic retinopathy is caused by blood sugar (glucose) levels that are too high over an extended period of time. High blood sugars cause damage to the small blood vessels of the retina, allowing blood to leak through the vessel walls. This causes visual impairment and eventually blindness. What increases the risk?  High blood pressure.  Having diabetes for a long time.  Having poorly controlled blood sugars. What are the signs or symptoms? In the early stages of diabetic retinopathy, there are often no symptoms. As the condition advances, symptoms may include:  Blurred vision. This is usually caused by a swelling due to abnormal blood glucose levels. The blurriness may go away when blood glucose levels return to normal.  Moving specks or dark spots (floaters) in your vision. These can be caused by a small retinal hemorrhage. A hemorrhage is bleeding from blood vessels.  Missing parts of your field of vision, such as things at the side. This can be caused by larger retinal hemorrhages.  Difficulty reading books or signs.  Double vision.  Pain in one or both eyes.  Feeling pressure in one or both eyes.  Trouble seeing straight lines. Straight lines do not look straight.  Redness of the eyes that does not go away.  How is this diagnosed? Your eye care specialist can detect changes in the blood vessels of your eye by putting drops in your eyes that enlarge (dilate) your pupils. This allows your eye care specialist to get a good look at your retina to see if there are any changes that have occurred as a result of your diabetes. You should have your eyes examined once a year. How is this treated? Your eye care  specialist may use a special laser beam to seal the blood vessels of the retina and stop them from leaking. Early detection and treatment are important so that further damage to your eyes can be prevented. In addition, managing your blood sugars and keeping them in the target range can slow the progress of the disease. Follow these instructions at home:  Keep your blood pressure within your target range.  Keep your blood glucose levels within your target range.  Follow your health care provider's instructions regarding diet and other means for controlling your blood glucose levels.  Check your blood levels for glucose as recommended by your health care provider.  Keep regular appointments with your eye specialist. An eye specialist can usually see diabetic retinopathy developing long before it starts causing problems. In many cases, it can be treated to prevent complications from occurring. If you have diabetes, you should have your eyes checked at least every year. Your risk of retinopathy increases the longer you have the disease.  If you smoke, quit. Ask your health care provider for help if needed. Smoking can make retinopathy worse. Contact a health care provider if:  You notice gradual blurring or other changes in your vision over time.  You notice that your glasses or contact lenses do not make things look as sharp as they once did.  You have trouble reading or seeing details at a distance with either eye.  You notice a sudden change in your vision or notice that parts of your field of vision appear   missing or hazy.  You suddenly see moving specks or dark spots in the field of vision of either eye.  You have sudden partial loss of vision in either eye. This information is not intended to replace advice given to you by your health care provider. Make sure you discuss any questions you have with your health care provider. Document Released: 09/09/2000 Document Revised: 02/18/2016  Document Reviewed: 03/04/2013 Elsevier Interactive Patient Education  2017 Elsevier Inc.  

## 2017-02-01 NOTE — Progress Notes (Signed)
Subjective:  Patient ID: Danielle Huerta, female    DOB: Jun 04, 1976  Age: 41 y.o. MRN: [id number]  CC: f/u HTN and do annual physical  HPI Danielle Huerta is a 41 y.o. female with a PMH of PCOS presents to f/u on HTN and to have annual physical done. Last BP on 12/29/16 145/92. Today 117/74. Taking medications as directed. Says she already had PAP done at GYN. Has been managing her DM2 at GYN also. Says GYN told her to return for next A1c in six months.  Denies any symptoms whatsoever.    Outpatient Medications Prior to Visit  Medication Sig Dispense Refill  . acetaminophen (TYLENOL) 500 MG tablet Take 1 tablet (500 mg total) by mouth every 6 (six) hours as needed for moderate pain. 30 tablet 0  . ibuprofen (ADVIL,MOTRIN) 600 MG tablet Take 1 tablet (600 mg total) by mouth every 8 (eight) hours as needed. 30 tablet 0  . lisinopril-hydrochlorothiazide (PRINZIDE,ZESTORETIC) 20-25 MG tablet Take 1 tablet by mouth daily. 30 tablet 3  . sodium chloride (OCEAN) 0.65 % SOLN nasal spray Place 1 spray into both nostrils as needed for congestion. 1 Bottle 0   No facility-administered medications prior to visit.      ROS Review of Systems  Constitutional: Negative for chills, fever and malaise/fatigue.  Eyes: Negative for blurred vision.  Respiratory: Negative for shortness of breath.   Cardiovascular: Negative for chest pain and palpitations.  Gastrointestinal: Negative for abdominal pain and nausea.  Genitourinary: Negative for dysuria and hematuria.  Musculoskeletal: Negative for joint pain and myalgias.  Skin: Negative for rash.  Neurological: Negative for tingling and headaches.  Psychiatric/Behavioral: Negative for depression. The patient is not nervous/anxious.     Objective:  BP 117/74 (BP Location: Left Arm, Patient Position: Sitting, Cuff Size: Large)   Pulse 85   Temp 97.8 F (36.6 C) (Oral)   Wt 228 lb 12.8 oz (103.8 kg)   LMP 01/18/2017 (Exact Date)   SpO2 98%   Breastfeeding?  No   BMI 36.93 kg/m   BP/Weight 02/01/2017 12/29/2016 12/02/2016  Systolic BP 117 145 141  Diastolic BP 74 92 90  Wt. (Lbs) 228.8 232.8 -  BMI 36.93 37.57 -      Physical Exam  Constitutional: She is oriented to person, place, and time.  Well developed, obese, NAD, polite  HENT:  Head: Normocephalic and atraumatic.  Eyes: No scleral icterus.  Neck: Normal range of motion. Neck supple. No thyromegaly present.  Cardiovascular: Normal rate, regular rhythm and normal heart sounds.   Pulmonary/Chest: Effort normal and breath sounds normal. No respiratory distress. She has no wheezes. She has no rales.  Abdominal: Soft. Bowel sounds are normal. She exhibits no mass. There is no tenderness. There is no rebound and no guarding.  Musculoskeletal: She exhibits no edema or deformity.  LEs, UEs, and back with full aROM  Lymphadenopathy:    She has no cervical adenopathy.  Neurological: She is alert and oriented to person, place, and time. No cranial nerve deficit. Coordination normal.  Skin: Skin is warm and dry. No rash noted. No erythema. No pallor.  Hirsutism   Psychiatric: She has a normal mood and affect. Her behavior is normal. Thought content normal.  Vitals reviewed.    Assessment & Plan:   1. Annual physical exam - Urinalysis Dipstick normal in clinic today - TSH - CBC with Differential - Comprehensive metabolic panel - Lipid panel  2. Need for Tdap vaccination - Tdap vaccine greater than or  equal to 7yo IM  3. Type 2 diabetes mellitus without complication, without long-term current use of insulin (HCC) - Ambulatory referral to Ophthalmology - Microalbumin/Creatinine Ratio, Urine - HgB A1c 6.3% in clinic today   No orders of the defined types were placed in this encounter.   Follow-up: Return in about 6 months (around 08/04/2017), or if symptoms worsen or fail to improve, for Diabetes f/u.   Loletta Specter PA

## 2017-02-02 LAB — CBC WITH DIFFERENTIAL/PLATELET
Basophils Absolute: 0 10*3/uL (ref 0.0–0.2)
Basos: 1 %
EOS (ABSOLUTE): 0.1 10*3/uL (ref 0.0–0.4)
Eos: 2 %
HEMATOCRIT: 36.2 % (ref 34.0–46.6)
HEMOGLOBIN: 12.3 g/dL (ref 11.1–15.9)
Immature Grans (Abs): 0 10*3/uL (ref 0.0–0.1)
Immature Granulocytes: 0 %
LYMPHS ABS: 1.2 10*3/uL (ref 0.7–3.1)
Lymphs: 36 %
MCH: 28.2 pg (ref 26.6–33.0)
MCHC: 34 g/dL (ref 31.5–35.7)
MCV: 83 fL (ref 79–97)
MONOCYTES: 11 %
MONOS ABS: 0.4 10*3/uL (ref 0.1–0.9)
NEUTROS ABS: 1.7 10*3/uL (ref 1.4–7.0)
Neutrophils: 50 %
Platelets: 255 10*3/uL (ref 150–379)
RBC: 4.36 x10E6/uL (ref 3.77–5.28)
RDW: 15 % (ref 12.3–15.4)
WBC: 3.4 10*3/uL (ref 3.4–10.8)

## 2017-02-02 LAB — COMPREHENSIVE METABOLIC PANEL
ALK PHOS: 79 IU/L (ref 39–117)
ALT: 16 IU/L (ref 0–32)
AST: 25 IU/L (ref 0–40)
Albumin/Globulin Ratio: 1.4 (ref 1.2–2.2)
Albumin: 4.3 g/dL (ref 3.5–5.5)
BUN/Creatinine Ratio: 8 — ABNORMAL LOW (ref 9–23)
BUN: 8 mg/dL (ref 6–20)
Bilirubin Total: 0.4 mg/dL (ref 0.0–1.2)
CHLORIDE: 101 mmol/L (ref 96–106)
CO2: 27 mmol/L (ref 18–29)
Calcium: 9 mg/dL (ref 8.7–10.2)
Creatinine, Ser: 0.96 mg/dL (ref 0.57–1.00)
GFR calc Af Amer: 86 mL/min/{1.73_m2} (ref >59)
GFR calc non Af Amer: 75 mL/min/{1.73_m2} (ref >59)
GLOBULIN, TOTAL: 3.1 g/dL (ref 1.5–4.5)
Glucose: 105 mg/dL — ABNORMAL HIGH (ref 65–99)
Potassium: 3.7 mmol/L (ref 3.5–5.2)
Sodium: 142 mmol/L (ref 134–144)
Total Protein: 7.4 g/dL (ref 6.0–8.5)

## 2017-02-02 LAB — LIPID PANEL
Chol/HDL Ratio: 3 ratio (ref 0.0–4.4)
Cholesterol, Total: 182 mg/dL (ref 100–199)
HDL: 61 mg/dL (ref >39)
LDL Calculated: 108 mg/dL — ABNORMAL HIGH (ref 0–99)
TRIGLYCERIDES: 67 mg/dL (ref 0–149)
VLDL CHOLESTEROL CAL: 13 mg/dL (ref 5–40)

## 2017-02-02 LAB — MICROALBUMIN / CREATININE URINE RATIO
CREATININE, UR: 99.6 mg/dL
Microalb/Creat Ratio: <3 mg/g creat (ref 0.0–30.0)
Microalbumin, Urine: <3 ug/mL

## 2017-02-02 LAB — TSH: TSH: 3.34 u[IU]/mL (ref 0.450–4.500)

## 2017-06-06 ENCOUNTER — Encounter (HOSPITAL_COMMUNITY): Payer: Self-pay | Admitting: *Deleted

## 2017-06-06 ENCOUNTER — Emergency Department (HOSPITAL_COMMUNITY)
Admission: EM | Admit: 2017-06-06 | Discharge: 2017-06-06 | Disposition: A | Discharge status: Home / Self Care | Payer: BLUE CROSS/BLUE SHIELD | Attending: Emergency Medicine | Admitting: Emergency Medicine

## 2017-06-06 DIAGNOSIS — Z5321 Procedure and treatment not carried out due to patient leaving prior to being seen by health care provider: Secondary | ICD-10-CM | POA: Insufficient documentation

## 2017-06-06 DIAGNOSIS — N939 Abnormal uterine and vaginal bleeding, unspecified: Secondary | ICD-10-CM | POA: Insufficient documentation

## 2017-06-06 NOTE — ED Triage Notes (Addendum)
Pt reports LMP 8/28th. Started to have vaginal bleeding x 2 days ago with large clots yesterday while she was at work.  Pt denies any dizziness or lightheadedness.  She also denies any cramping.  Pt reports using 3 pads every hour.

## 2017-06-22 ENCOUNTER — Ambulatory Visit (INDEPENDENT_AMBULATORY_CARE_PROVIDER_SITE_OTHER): Payer: BLUE CROSS/BLUE SHIELD | Admitting: Physician Assistant

## 2017-06-22 ENCOUNTER — Encounter (INDEPENDENT_AMBULATORY_CARE_PROVIDER_SITE_OTHER): Payer: Self-pay | Admitting: Physician Assistant

## 2017-06-22 VITALS — BP 136/94 | HR 85 | Temp 97.9°F | Wt 231.2 lb

## 2017-06-22 DIAGNOSIS — H9202 Otalgia, left ear: Secondary | ICD-10-CM

## 2017-06-22 DIAGNOSIS — Z760 Encounter for issue of repeat prescription: Secondary | ICD-10-CM

## 2017-06-22 MED ORDER — GUAIFENESIN ER 1200 MG PO TB12: 1.0000 | ORAL_TABLET | Freq: Two times a day (BID) | ORAL | 0 refills | Status: AC

## 2017-06-22 MED ORDER — PREDNISONE 20 MG PO TABS: 60.0000 mg | ORAL_TABLET | Freq: Every day | ORAL | 0 refills | Status: AC

## 2017-06-22 MED ORDER — LISINOPRIL-HYDROCHLOROTHIAZIDE 20-25 MG PO TABS: 1.0000 | ORAL_TABLET | Freq: Every day | ORAL | 3 refills | Status: DC

## 2017-06-22 NOTE — Progress Notes (Signed)
Subjective:  Patient ID: Danielle Huerta, female    DOB: 29-Sep-1975  Age: 41 y.o. MRN: [id number]  CC: clogged left ear  HPI Danielle Huerta is a 41 y.o. female with a medical history of PCOS, Obesity, hirsutism, and gestational diabetes presents with "clogging" of the left ear since six days ago. Feels as if a bug may have crept inside the ear. Denies nasal congestion, malaise, fatigue, fever, chills, nausea, vomiting, vertigo, loss of audition, or tinnitus.        Outpatient Medications Prior to Visit  Medication Sig Dispense Refill  . acetaminophen (TYLENOL) 500 MG tablet Take 1 tablet (500 mg total) by mouth every 6 (six) hours as needed for moderate pain. 30 tablet 0  . ibuprofen (ADVIL,MOTRIN) 600 MG tablet Take 1 tablet (600 mg total) by mouth every 8 (eight) hours as needed. 30 tablet 0  . lisinopril-hydrochlorothiazide (PRINZIDE,ZESTORETIC) 20-25 MG tablet Take 1 tablet by mouth daily. 30 tablet 3  . sodium chloride (OCEAN) 0.65 % SOLN nasal spray Place 1 spray into both nostrils as needed for congestion. (Patient not taking: Reported on 06/22/2017) 1 Bottle 0   No facility-administered medications prior to visit.      ROS Review of Systems  Constitutional: Negative for chills, fever and malaise/fatigue.  Eyes: Negative for blurred vision.  Respiratory: Negative for shortness of breath.   Cardiovascular: Negative for chest pain and palpitations.  Gastrointestinal: Negative for abdominal pain and nausea.  Genitourinary: Negative for dysuria and hematuria.  Musculoskeletal: Negative for joint pain and myalgias.  Skin: Negative for rash.  Neurological: Negative for tingling and headaches.  Psychiatric/Behavioral: Negative for depression. The patient is not nervous/anxious.     Objective:  BP (!) 160/113 (BP Location: Left Arm, Patient Position: Sitting, Cuff Size: Large)   Pulse 85   Temp 97.9 F (36.6 C) (Oral)   Wt 231 lb 3.2 oz (104.9 kg)   LMP 05/23/2017   SpO2 97%    Breastfeeding? No   BMI 37.32 kg/m   BP/Weight 06/22/2017 06/06/2017 02/01/2017  Systolic BP 160 158 117  Diastolic BP 113 107 74  Wt. (Lbs) 231.2 232 228.8  BMI 37.32 37.45 36.93      Physical Exam  Constitutional: She is oriented to person, place, and time.  Well developed, well nourished, NAD, polite  HENT:  Head: Normocephalic and atraumatic.  TM normal bilaterally, ear canals without foreign object, erythema, or edema. Mild postnasal drip. Turbinates hypertrophic and pale bilaterally.  Eyes: No scleral icterus.  Neck: Normal range of motion. Neck supple. No thyromegaly present.  Cardiovascular: Normal rate, regular rhythm and normal heart sounds.   Pulmonary/Chest: Effort normal and breath sounds normal.  Musculoskeletal: She exhibits no edema.  Lymphadenopathy:    She has no cervical adenopathy.  Neurological: She is alert and oriented to person, place, and time.  Skin: Skin is warm and dry. No rash noted. No erythema. No pallor.  Psychiatric: She has a normal mood and affect. Her behavior is normal. Thought content normal.  Vitals reviewed.    Assessment & Plan:   1. Otalgia of left ear - Begin predniSONE (DELTASONE) 20 MG tablet; Take 3 tablets (60 mg total) by mouth daily with breakfast.  Dispense: 21 tablet; Refill: 0 - Begin Guaifenesin (MUCINEX MAXIMUM STRENGTH) 1200 MG TB12; Take 1 tablet (1,200 mg total) by mouth 2 (two) times daily.  Dispense: 10 tablet; Refill: 0  2. Medication refill - Refill lisinopril-hydrochlorothiazide (PRINZIDE,ZESTORETIC) 20-25 MG tablet; Take 1 tablet by mouth  daily.  Dispense: 90 tablet; Refill: 3   Meds ordered this encounter  Medications  . predniSONE (DELTASONE) 20 MG tablet    Sig: Take 3 tablets (60 mg total) by mouth daily with breakfast.    Dispense:  21 tablet    Refill:  0    Order Specific Question:   Supervising Provider    Answer:   Sindy Messing DAVID [id number]  . Guaifenesin (MUCINEX MAXIMUM STRENGTH) 1200 MG  TB12    Sig: Take 1 tablet (1,200 mg total) by mouth 2 (two) times daily.    Dispense:  10 tablet    Refill:  0    Order Specific Question:   Supervising Provider    Answer:   Sindy Messing DAVID [id number]  . lisinopril-hydrochlorothiazide (PRINZIDE,ZESTORETIC) 20-25 MG tablet    Sig: Take 1 tablet by mouth daily.    Dispense:  90 tablet    Refill:  3    Order Specific Question:   Supervising Provider    Answer:   Sindy Messing DAVID [id number]    Follow-up: Return in about 4 months (around 10/22/2017) for HTN.   Loletta Specter PA

## 2017-06-22 NOTE — Patient Instructions (Signed)
Eustachian Tube Dysfunction The eustachian tube connects the middle ear to the back of the nose. It regulates air pressure in the middle ear by allowing air to move between the ear and nose. It also helps to drain fluid from the middle ear space. When the eustachian tube does not function properly, air pressure, fluid, or both can build up in the middle ear. Eustachian tube dysfunction can affect one or both ears. What are the causes? This condition happens when the eustachian tube becomes blocked or cannot open normally. This may result from:  Ear infections.  Colds and other upper respiratory infections.  Allergies.  Irritation, such as from cigarette smoke or acid from the stomach coming up into the esophagus (gastroesophageal reflux).  Sudden changes in air pressure, such as from descending in an airplane.  Abnormal growths in the nose or throat, such as nasal polyps, tumors, or enlarged tissue at the back of the throat (adenoids).  What increases the risk? This condition may be more likely to develop in people who smoke and people who are overweight. Eustachian tube dysfunction may also be more likely to develop in children, especially children who have:  Certain birth defects of the mouth, such as cleft palate.  Large tonsils and adenoids.  What are the signs or symptoms? Symptoms of this condition may include:  A feeling of fullness in the ear.  Ear pain.  Clicking or popping noises in the ear.  Ringing in the ear.  Hearing loss.  Loss of balance.  Symptoms may get worse when the air pressure around you changes, such as when you travel to an area of high elevation or fly on an airplane. How is this diagnosed? This condition may be diagnosed based on:  Your symptoms.  A physical exam of your ear, nose, and throat.  Tests, such as those that measure: ? The movement of your eardrum (tympanogram). ? Your hearing (audiometry).  How is this treated? Treatment  depends on the cause and severity of your condition. If your symptoms are mild, you may be able to relieve your symptoms by moving air into ("popping") your ears. If you have symptoms of fluid in your ears, treatment may include:  Decongestants.  Antihistamines.  Nasal sprays or ear drops that contain medicines that reduce swelling (steroids).  In some cases, you may need to have a procedure to drain the fluid in your eardrum (myringotomy). In this procedure, a small tube is placed in the eardrum to:  Drain the fluid.  Restore the air in the middle ear space.  Follow these instructions at home:  Take over-the-counter and prescription medicines only as told by your health care provider.  Use techniques to help pop your ears as recommended by your health care provider. These may include: ? Chewing gum. ? Yawning. ? Frequent, forceful swallowing. ? Closing your mouth, holding your nose closed, and gently blowing as if you are trying to blow air out of your nose.  Do not do any of the following until your health care provider approves: ? Travel to high altitudes. ? Fly in airplanes. ? Work in a pressurized cabin or room. ? Scuba dive.  Keep your ears dry. Dry your ears completely after showering or bathing.  Do not smoke.  Keep all follow-up visits as told by your health care provider. This is important. Contact a health care provider if:  Your symptoms do not go away after treatment.  Your symptoms come back after treatment.  You are   unable to pop your ears.  You have: ? A fever. ? Pain in your ear. ? Pain in your head or neck. ? Fluid draining from your ear.  Your hearing suddenly changes.  You become very dizzy.  You lose your balance. This information is not intended to replace advice given to you by your health care provider. Make sure you discuss any questions you have with your health care provider. Document Released: 10/09/2015 Document Revised: 02/18/2016  Document Reviewed: 10/01/2014 Elsevier Interactive Patient Education  2018 [employer]  

## 2017-08-04 ENCOUNTER — Other Ambulatory Visit (INDEPENDENT_AMBULATORY_CARE_PROVIDER_SITE_OTHER): Payer: BLUE CROSS/BLUE SHIELD

## 2017-10-24 ENCOUNTER — Ambulatory Visit (INDEPENDENT_AMBULATORY_CARE_PROVIDER_SITE_OTHER): Payer: BLUE CROSS/BLUE SHIELD | Admitting: Physician Assistant

## 2018-11-05 DIAGNOSIS — R7303 Prediabetes: Secondary | ICD-10-CM | POA: Insufficient documentation

## 2019-06-18 DIAGNOSIS — I10 Essential (primary) hypertension: Secondary | ICD-10-CM | POA: Insufficient documentation

## 2019-07-10 DIAGNOSIS — E282 Polycystic ovarian syndrome: Secondary | ICD-10-CM | POA: Insufficient documentation

## 2020-02-20 ENCOUNTER — Emergency Department (HOSPITAL_COMMUNITY)
Admission: EM | Admit: 2020-02-20 | Discharge: 2020-02-20 | Discharge status: Absent without leave | Payer: BLUE CROSS/BLUE SHIELD

## 2020-02-20 ENCOUNTER — Other Ambulatory Visit: Payer: Self-pay

## 2020-03-04 ENCOUNTER — Encounter (HOSPITAL_COMMUNITY): Payer: Self-pay | Admitting: Emergency Medicine

## 2020-03-04 ENCOUNTER — Other Ambulatory Visit: Payer: Self-pay

## 2020-03-04 ENCOUNTER — Emergency Department (HOSPITAL_COMMUNITY)
Admission: EM | Admit: 2020-03-04 | Discharge: 2020-03-04 | Disposition: A | Discharge status: Home / Self Care | Payer: BLUE CROSS/BLUE SHIELD | Attending: Emergency Medicine | Admitting: Emergency Medicine

## 2020-03-04 DIAGNOSIS — I10 Essential (primary) hypertension: Secondary | ICD-10-CM | POA: Diagnosis not present

## 2020-03-04 DIAGNOSIS — Z880 Allergy status to penicillin: Secondary | ICD-10-CM | POA: Diagnosis not present

## 2020-03-04 DIAGNOSIS — R002 Palpitations: Secondary | ICD-10-CM | POA: Diagnosis not present

## 2020-03-04 DIAGNOSIS — Z87891 Personal history of nicotine dependence: Secondary | ICD-10-CM | POA: Insufficient documentation

## 2020-03-04 DIAGNOSIS — R519 Headache, unspecified: Secondary | ICD-10-CM | POA: Diagnosis present

## 2020-03-04 DIAGNOSIS — Z79899 Other long term (current) drug therapy: Secondary | ICD-10-CM | POA: Diagnosis not present

## 2020-03-04 MED ORDER — ACETAMINOPHEN 325 MG PO TABS
650.0000 mg | ORAL_TABLET | Freq: Once | ORAL | Status: AC
  Administered 2020-03-04: 650 mg via ORAL
  Filled 2020-03-04: qty 2

## 2020-03-04 MED ORDER — METOCLOPRAMIDE HCL 5 MG/ML IJ SOLN: 10.0000 mg | Freq: Once | INTRAMUSCULAR | Status: DC

## 2020-03-04 MED ORDER — METOCLOPRAMIDE HCL 5 MG/ML IJ SOLN
10.0000 mg | Freq: Once | INTRAMUSCULAR | Status: AC
  Administered 2020-03-04: 10 mg via INTRAMUSCULAR
  Filled 2020-03-04: qty 2

## 2020-03-04 NOTE — Discharge Instructions (Addendum)
We signed the ER for the headache and palpitations. Our exam is overall reassuring.

## 2020-03-04 NOTE — ED Provider Notes (Signed)
[facility]-EMERGENCY DEPT Provider Note   CSN: [id number] Arrival date & time: 03/04/20  1119     History Chief Complaint  Patient presents with  . Headache  . Palpitations    Danielle Huerta is a 44 y.o. female.  HPI    44 year old female comes in a chief complaint of headache and palpitations.  She has history of PCOS and hypertension.  She reports that she has been having some sinus issues and went to ENT today to get a steroid shot.  Subsequently she started having headaches along with palpitations which got her concerned.  She has been having headaches off and on for the last several days.  She takes Advil for it.  Palpitations worsen or new.  She describes palpitations as throbbing, racing feeling without any associated dizziness, shortness of breath, chest pain.  Patient's headache is also described as throbbing.  She does not have any specific aggravating, evoking, relieving factors.  Headaches are not positional and typically present during daytime.  She thinks that weather, temperature might be causing the headaches.  She has no history of clotting.  She has no family history of brain tumors, brain aneurysms or brain bleed.  She denies any associated neurologic symptoms.   Past Medical History:  Diagnosis Date  . Gestational diabetes   . History of female hirsutism   . Obesity   . PCOS (polycystic ovarian syndrome)     Patient Active Problem List   Diagnosis Date Noted  . Gestational hypertension 08/18/2016  . Postpartum care following cesarean delivery (11/22)  08/17/2016  . Pregnancy diabetes 08/16/2016    Past Surgical History:  Procedure Laterality Date  . CERVICAL CERCLAGE N/A 04/02/2016   Procedure: Emergency [facility] CERVICAL CERCLAGE;  Surgeon: Shea Evans, MD;  Location: [facility] ORS;  Service: Gynecology;  Laterality: N/A;  . CESAREAN SECTION N/A 08/17/2016   Procedure: CESAREAN SECTION;  Surgeon: Noland Fordyce, MD;  Location: [facility];  Service: Obstetrics;  Laterality: N/A;  . DILATATION & CURRETTAGE/HYSTEROSCOPY WITH RESECTOCOPE  08/16/2012   Procedure: DILATATION & CURETTAGE/HYSTEROSCOPY WITH RESECTOCOPE;  Surgeon: Geryl Rankins, MD;  Location: [facility] ORS;  Service: Gynecology;  Laterality: N/A;  . THERAPEUTIC ABORTION       OB History    Gravida  5   Para  1   Term  1   Preterm  0   AB  4   Living  0     SAB  1   TAB  3   Ectopic  0   Multiple  0   Live Births              Family History  Problem Relation Age of Onset  . Diabetes Father   . Heart disease Father   . Stroke Father   . Cancer Maternal Aunt   . Cancer Maternal Uncle     Social History   Tobacco Use  . Smoking status: Former Smoker    Packs/day: 0.25    Years: 15.00    Pack years: 3.75    Types: Cigarettes  . Smokeless tobacco: Never Used  Substance Use Topics  . Alcohol use: Yes    Comment: socially  . Drug use: No    Home Medications Prior to Admission medications   Medication Sig Start Date End Date Taking? Authorizing Provider  amLODipine (NORVASC) 5 MG tablet Take 5 mg by mouth in the morning and at bedtime.   Yes [provider]  Ibuprofen-Acetaminophen (ADVIL DUAL  ACTION) 125-250 MG TABS Take 1 tablet by mouth 2 (two) times daily as needed (cold symptoms).   Yes [provider]  acetaminophen (TYLENOL) 500 MG tablet Take 1 tablet (500 mg total) by mouth every 6 (six) hours as needed for moderate pain. Patient not taking: Reported on 03/04/2020 04/02/16   Azucena Fallen, MD  ibuprofen (ADVIL,MOTRIN) 600 MG tablet Take 1 tablet (600 mg total) by mouth every 8 (eight) hours as needed. Patient not taking: Reported on 03/04/2020 11/30/16   Clent Demark, PA-C  predniSONE (DELTASONE) 20 MG tablet Take 3 tablets (60 mg total) by mouth daily with breakfast. Patient not taking: Reported on 03/04/2020 06/22/17   Clent Demark, PA-C    Allergies    Amoxicillin  Review of Systems   Review  of Systems  Constitutional: Positive for activity change.  Respiratory: Negative for cough and shortness of breath.   Cardiovascular: Positive for palpitations. Negative for chest pain.  Gastrointestinal: Negative for abdominal pain.  Allergic/Immunologic: Negative for immunocompromised state.  Neurological: Positive for headaches. Negative for dizziness, tremors, seizures, syncope, facial asymmetry, speech difficulty, weakness, light-headedness and numbness.  All other systems reviewed and are negative.   Physical Exam Updated Vital Signs BP (!) 147/104   Pulse 75   Temp 98.8 F (37.1 C) (Oral)   Resp 18   LMP 02/04/2020   SpO2 99%   Physical Exam Vitals and nursing note reviewed.  Constitutional:      Appearance: She is well-developed.  HENT:     Head: Normocephalic and atraumatic.  Eyes:     General: No visual field deficit. Cardiovascular:     Rate and Rhythm: Normal rate.  Pulmonary:     Effort: Pulmonary effort is normal.  Abdominal:     General: Bowel sounds are normal.  Musculoskeletal:     Cervical back: Normal range of motion and neck supple.  Skin:    General: Skin is warm and dry.  Neurological:     Mental Status: She is alert and oriented to person, place, and time.     Cranial Nerves: No cranial nerve deficit, dysarthria or facial asymmetry.     ED Results / Procedures / Treatments   Labs (all labs ordered are listed, but only abnormal results are displayed) Labs Reviewed - No data to display  EKG None  Radiology No results found.  Procedures Procedures (including critical care time)  Medications Ordered in ED Medications  acetaminophen (TYLENOL) tablet 650 mg (650 mg Oral Given 03/04/20 1301)  metoCLOPramide (REGLAN) injection 10 mg (10 mg Intramuscular Given 03/04/20 1301)    ED Course  I have reviewed the triage vital signs and the nursing notes.  Pertinent labs & imaging results that were available during my care of the patient were  reviewed by me and considered in my medical decision making (see chart for details).    MDM Rules/Calculators/A&P                      44 year old comes in a chief complaint of palpitations and headache. She is healthy.  She has history of PCOS but denies any complications from it and is not on any OCPs.  No history of thrombosis no risk factors for it either.  Family history is benign, social history is not concerning.  Headaches are not acute and there is no red flags for elevated ICP.  We will give her some Reglan IM for it.  Patient is also complaining of  palpitations that have now resolved.  She did receive steroid shot earlier today and she is attributing that to her palpitations.  I am not sure what caused the palpitations but her EKG right now is reassuring.  She had no symptoms associated with palpitations that were concerning, and has no underlying coronary disease therefore I do not think she needs Holter monitor at this time.  Stable for discharge.  Final Clinical Impression(s) / ED Diagnoses Final diagnoses:  Palpitations  Nonintractable episodic headache, unspecified headache type    Rx / DC Orders ED Discharge Orders    None       Derwood Kaplan, MD 03/04/20 1328

## 2020-03-04 NOTE — ED Triage Notes (Signed)
Patient c/o headache and palpitations today. Reports seen at ENT today and got steroid shot for nasal congestion. Denies chest pain and SOB.

## 2020-08-10 DIAGNOSIS — R399 Unspecified symptoms and signs involving the genitourinary system: Secondary | ICD-10-CM | POA: Insufficient documentation

## 2021-12-21 DIAGNOSIS — N939 Abnormal uterine and vaginal bleeding, unspecified: Secondary | ICD-10-CM | POA: Insufficient documentation

## 2022-12-13 DIAGNOSIS — K429 Umbilical hernia without obstruction or gangrene: Secondary | ICD-10-CM | POA: Insufficient documentation

## 2023-02-03 ENCOUNTER — Emergency Department (HOSPITAL_COMMUNITY): Payer: Medicaid Other

## 2023-02-03 ENCOUNTER — Emergency Department (HOSPITAL_COMMUNITY)
Admission: EM | Admit: 2023-02-03 | Discharge: 2023-02-03 | Disposition: A | Discharge status: Home / Self Care | Payer: Medicaid Other | Attending: Emergency Medicine | Admitting: Emergency Medicine

## 2023-02-03 ENCOUNTER — Other Ambulatory Visit: Payer: Self-pay

## 2023-02-03 DIAGNOSIS — R109 Unspecified abdominal pain: Secondary | ICD-10-CM | POA: Diagnosis present

## 2023-02-03 DIAGNOSIS — I10 Essential (primary) hypertension: Secondary | ICD-10-CM | POA: Insufficient documentation

## 2023-02-03 DIAGNOSIS — Z79899 Other long term (current) drug therapy: Secondary | ICD-10-CM | POA: Diagnosis not present

## 2023-02-03 DIAGNOSIS — K460 Unspecified abdominal hernia with obstruction, without gangrene: Secondary | ICD-10-CM | POA: Insufficient documentation

## 2023-02-03 LAB — COMPREHENSIVE METABOLIC PANEL
ALT: 14 U/L (ref 0–44)
AST: 25 U/L (ref 15–41)
Albumin: 4.4 g/dL (ref 3.5–5.0)
Alkaline Phosphatase: 73 U/L (ref 38–126)
Anion gap: 13 (ref 5–15)
BUN: 21 mg/dL — ABNORMAL HIGH (ref 6–20)
CO2: 25 mmol/L (ref 22–32)
Calcium: 9.2 mg/dL (ref 8.9–10.3)
Chloride: 99 mmol/L (ref 98–111)
Creatinine, Ser: 1.13 mg/dL — ABNORMAL HIGH (ref 0.44–1.00)
GFR, Estimated: >60 mL/min (ref >60)
Glucose, Bld: 129 mg/dL — ABNORMAL HIGH (ref 70–99)
Potassium: 3.3 mmol/L — ABNORMAL LOW (ref 3.5–5.1)
Sodium: 137 mmol/L (ref 135–145)
Total Bilirubin: 1.1 mg/dL (ref 0.3–1.2)
Total Protein: 8.7 g/dL — ABNORMAL HIGH (ref 6.5–8.1)

## 2023-02-03 LAB — CBC
HCT: 45.4 % (ref 36.0–46.0)
Hemoglobin: 14.9 g/dL (ref 12.0–15.0)
MCH: 27.7 pg (ref 26.0–34.0)
MCHC: 32.8 g/dL (ref 30.0–36.0)
MCV: 84.4 fL (ref 80.0–100.0)
Platelets: 321 10*3/uL (ref 150–400)
RBC: 5.38 MIL/uL — ABNORMAL HIGH (ref 3.87–5.11)
RDW: 13.3 % (ref 11.5–15.5)
WBC: 8.6 10*3/uL (ref 4.0–10.5)
nRBC: 0 % (ref 0.0–0.2)

## 2023-02-03 LAB — URINALYSIS, ROUTINE W REFLEX MICROSCOPIC
Bilirubin Urine: NEGATIVE
Glucose, UA: NEGATIVE mg/dL
Hgb urine dipstick: NEGATIVE
Ketones, ur: 20 mg/dL — AB
Leukocytes,Ua: NEGATIVE
Nitrite: NEGATIVE
Protein, ur: 30 mg/dL — AB
Specific Gravity, Urine: <1.005 — ABNORMAL LOW (ref 1.005–1.030)
pH: 6 (ref 5.0–8.0)

## 2023-02-03 LAB — LIPASE, BLOOD: Lipase: 26 U/L (ref 11–51)

## 2023-02-03 MED ORDER — LACTATED RINGERS IV BOLUS
1000.0000 mL | Freq: Once | INTRAVENOUS | Status: AC
  Administered 2023-02-03: 1000 mL via INTRAVENOUS

## 2023-02-03 MED ORDER — SODIUM CHLORIDE 0.9 % IV SOLN
12.5000 mg | Freq: Once | INTRAVENOUS | Status: AC
  Administered 2023-02-03: 12.5 mg via INTRAVENOUS
  Filled 2023-02-03: qty 12.5

## 2023-02-03 MED ORDER — IOHEXOL 300 MG/ML  SOLN
100.0000 mL | Freq: Once | INTRAMUSCULAR | Status: AC | PRN
  Administered 2023-02-03: 100 mL via INTRAVENOUS

## 2023-02-03 MED ORDER — ONDANSETRON HCL 4 MG/2ML IJ SOLN
4.0000 mg | Freq: Once | INTRAMUSCULAR | Status: AC
  Administered 2023-02-03: 4 mg via INTRAVENOUS
  Filled 2023-02-03: qty 2

## 2023-02-03 MED ORDER — SODIUM CHLORIDE (PF) 0.9 % IJ SOLN
INTRAMUSCULAR | Status: AC
  Filled 2023-02-03: qty 50

## 2023-02-03 MED ORDER — HYDROMORPHONE HCL 1 MG/ML IJ SOLN
0.5000 mg | Freq: Once | INTRAMUSCULAR | Status: AC
  Administered 2023-02-03: 0.5 mg via INTRAVENOUS
  Filled 2023-02-03: qty 1

## 2023-02-03 NOTE — ED Provider Notes (Signed)
Discussed with Dr. Marta Lamas from [facility] who is accepted the patient in transfer.  Patient will go to the ED at [facility] and discussed with Dr. Sherrie Mustache who is also aware of patient's transfer.   Lorre Nick, MD 02/03/23 [phone]

## 2023-02-03 NOTE — ED Notes (Signed)
Pt ambulatory to bathroom w/o assist. Steady gait

## 2023-02-03 NOTE — ED Notes (Signed)
Pt gone to CT 

## 2023-02-03 NOTE — ED Triage Notes (Addendum)
Pt arrived via POV. C/o N/V and abd pain for 3x days.  Had hernia repair 4/18  Unable to tolerate PO intake.  AOx4

## 2023-02-03 NOTE — ED Provider Notes (Signed)
[place] EMERGENCY DEPARTMENT AT [facility] Provider Note   CSN: [id number] Arrival date & time: 02/03/23  1033     History  Chief Complaint  Patient presents with   Abdominal Pain   Nausea   Emesis    Danielle Huerta is a 47 y.o. female with HTN, PCOS, AUB, prediabetes, umbilical hernia who presents with abd pain, N/V.   C/o N/V and abd pain for 3x days. Had hernia repair 01/12/23 , states she has pain/soreness rated 8/10 over the area where the hernia was, above her umbilicus.  She tried calling the surgery office who stated that it was not likely related to her surgery.  Also had some pain in her bilateral lumbar area.  Unable to tolerate PO intake.  Tried Tums at home which did not help.  Denies fever/chills, chest pain, shortness of breath, urinary symptoms, flank pain.  She has h/o hysterectomy, no vaginal symptoms. Had 1-2 episodes of diarrhea with no blood.     Abdominal Pain Associated symptoms: vomiting   Emesis Associated symptoms: abdominal pain        Home Medications Prior to Admission medications   Medication Sig Start Date End Date Taking? Authorizing Provider  acetaminophen (TYLENOL) 500 MG tablet Take 1 tablet (500 mg total) by mouth every 6 (six) hours as needed for moderate pain. Patient not taking: Reported on 03/04/2020 04/02/16   Shea Evans, MD  amLODipine (NORVASC) 5 MG tablet Take 5 mg by mouth in the morning and at bedtime.    [provider]  ibuprofen (ADVIL,MOTRIN) 600 MG tablet Take 1 tablet (600 mg total) by mouth every 8 (eight) hours as needed. Patient not taking: Reported on 03/04/2020 11/30/16   Loletta Specter, PA-C  Ibuprofen-Acetaminophen (ADVIL DUAL ACTION) 125-250 MG TABS Take 1 tablet by mouth 2 (two) times daily as needed (cold symptoms).    [provider]  predniSONE (DELTASONE) 20 MG tablet Take 3 tablets (60 mg total) by mouth daily with breakfast. Patient not taking: Reported on 03/04/2020 06/22/17    Loletta Specter, PA-C      Allergies    Amoxicillin    Review of Systems   Review of Systems  Gastrointestinal:  Positive for abdominal pain and vomiting.   Review of systems Negative for f/c.  A 10 point review of systems was performed and is negative unless otherwise reported in HPI.  Physical Exam Updated Vital Signs BP (!) 150/87   Pulse 70   Temp 98.9 F (37.2 C) (Oral)   Resp 18   Ht 5\' 6"  (1.676 m)   Wt 106.6 kg   LMP 02/04/2020   SpO2 97%   BMI 37.93 kg/m  Physical Exam General: Normal appearing female, lying in bed.  HEENT: Sclera anicteric, Dry  mucous membranes, trachea midline.  Cardiology: RRR, no murmurs/rubs/gallops. BL radial and DP pulses equal bilaterally.  Resp: Normal respiratory rate and effort. CTAB, no wheezes, rhonchi, crackles.  Abd: Well-healed surgical incisions.  Mild fullness just immediately superior to the umbilicus with tenderness palpation.  No overlying erythema or induration.  Mild epigastric tenderness palpation.  No right upper quadrant right lower quadrant or left lower quadrant tenderness palpation.  Soft, non-distended. No rebound tenderness or guarding.  GU: Deferred. MSK: No peripheral edema or signs of trauma. Extremities without deformity or TTP. No cyanosis or clubbing. Skin: warm, dry.  Back: No CVA tenderness Neuro: A&Ox4, CNs II-XII grossly intact. MAEs. Sensation grossly intact.  Psych: Normal mood and affect.  ED Results / Procedures / Treatments   Labs (all labs ordered are listed, but only abnormal results are displayed) Labs Reviewed  COMPREHENSIVE METABOLIC PANEL - Abnormal; Notable for the following components:      Result Value   Potassium 3.3 (*)    Glucose, Bld 129 (*)    BUN 21 (*)    Creatinine, Ser 1.13 (*)    Total Protein 8.7 (*)    All other components within normal limits  CBC - Abnormal; Notable for the following components:   RBC 5.38 (*)    All other components within normal limits   URINALYSIS, ROUTINE W REFLEX MICROSCOPIC - Abnormal; Notable for the following components:   Specific Gravity, Urine <1.005 (*)    Ketones, ur 20 (*)    Protein, ur 30 (*)    Bacteria, UA RARE (*)    All other components within normal limits  LIPASE, BLOOD    EKG None  Radiology CT ABDOMEN PELVIS W CONTRAST  Result Date: 02/03/2023 CLINICAL DATA:  Abdominal pain following hernia repair on 01/12/2023. EXAM: CT ABDOMEN AND PELVIS WITH CONTRAST TECHNIQUE: Multidetector CT imaging of the abdomen and pelvis was performed using the standard protocol following bolus administration of intravenous contrast. RADIATION DOSE REDUCTION: This exam was performed according to the departmental dose-optimization program which includes automated exposure control, adjustment of the mA and/or kV according to patient size and/or use of iterative reconstruction technique. CONTRAST:  OMNIPAQUE IOHEXOL 300 MG/ML  SOLN COMPARISON:  CT abdomen/pelvis 12/16/2021. FINDINGS: Lower chest: There is dependent subsegmental atelectasis in the lung bases. The imaged heart is unremarkable. Hepatobiliary: The liver is unremarkable. A gallstone is noted without evidence of acute cholecystitis. There is no biliary ductal dilatation. Pancreas: Unremarkable. Spleen: Unremarkable. Adrenals/Urinary Tract: The adrenals are unremarkable. The kidneys are unremarkable, with no focal lesion, stone, hydronephrosis, or hydroureter. The bladder is unremarkable. Stomach/Bowel: There is a small hiatal hernia. The stomach is otherwise unremarkable. The small bowel is significantly dilated measuring up to 4.2 cm transverse. This extends into the lower midline abdomen/pelvis with decompressed small bowel beyond this point. Findings are consistent with small-bowel obstruction. The small bowel appears herniated into the anterior abdominal wall through a 1.9 cm fascial defect (9-99). This hernia also contains fluid measuring slightly greater than  simple fluid attenuation but without peripheral enhancement to suggest infection. The distal small bowel is decompressed. The large bowel is normal. The appendix is normal. Vascular/Lymphatic: The abdominal aorta is normal in course and caliber. The major branch vessels are patent. The main portal and splenic veins are patent. There is no abdominal or pelvic lymphadenopathy. Reproductive: Uterus is surgically absent. There is a 1.9 cm left adnexal cyst requiring no specific imaging follow-up. There is no right adnexal mass. Other: There is small volume free fluid in the pelvis in addition to the herniated fluid described above. There is no free intraperitoneal air. There is no pneumatosis intestinalis or portal venous gas. Musculoskeletal: There is no acute osseous abnormality or suspicious osseous lesion. IMPRESSION: Fascial defect in the anterior abdominal wall with herniated small bowel resulting in small-bowel obstruction. The hernia also contains mildly free fluid. Surgical consultation is recommended. These results were called by telephone at the time of interpretation on 02/03/2023 at 1:57 pm to provider Mckaylee Dimalanta , who verbally acknowledged these results. Electronically Signed   By: Lesia Hausen M.D.   On: 02/03/2023 13:58    Procedures Procedures    Medications Ordered in ED Medications  ondansetron (  ZOFRAN) injection 4 mg (4 mg Intravenous Given 02/03/23 1150)  lactated ringers bolus 1,000 mL (0 mLs Intravenous Stopped 02/03/23 1259)  iohexol (OMNIPAQUE) 300 MG/ML solution 100 mL (100 mLs Intravenous Contrast Given 02/03/23 1309)  HYDROmorphone (DILAUDID) injection 0.5 mg (0.5 mg Intravenous Given 02/03/23 1457)  promethazine (PHENERGAN) 12.5 mg in sodium chloride 0.9 % 50 mL IVPB (12.5 mg Intravenous New Bag/Given 02/03/23 1500)    ED Course/ Medical Decision Making/ A&P                          Medical Decision Making Amount and/or Complexity of Data Reviewed Labs: ordered.  Decision-making details documented in ED Course. Radiology: ordered. Decision-making details documented in ED Course.  Risk Prescription drug management. Decision regarding hospitalization.    This patient presents to the ED for concern of abdominal pain, this involves an extensive number of treatment options, and is a complaint that carries with it a high risk of complications and morbidity.  I considered the following differential and admission for this acute, potentially life threatening condition.   MDM:    For DDX for abdominal pain includes but is not limited to:  Abdominal exam without peritoneal signs. No evidence of acute abdomen at this time.   Consider intra-abdominal infection such as intra-abdominal abscess, strangulated/incarcerated recurrent hernia, infected hernia mesh, complication related to the hernia surgery including ischemic bowel, small bowel obstruction.  No flank pain to raise concern for pyelonephritis or nephrolithiasis.  She has no right upper/lower quadrant tenderness palpation and low suspicion for acute hepatobiliary disease (including acute cholecystitis or cholangitis), acute appendicitis.  No left lower quadrant tenderness to raise concern for diverticulitis.  She does have mild epigastric tenderness palpation and states that she did have lower back pain as well, consider pancreatitis.  Continue renal injury or electrolyte derangements given decreased p.o. intake.  Patient has dry mucous membranes and will resuscitate with LR as well as Zofran for nausea.  Clinical Course as of 02/03/23 1542  Fri Feb 03, 2023  1215 WBC: 8.6 No leukocytosis [HN]  1215 Creatinine(!): 1.13 Mild elevation [HN]  1215 Lipase: 26 [HN]  1351 Per radiology, bowel herniated into abdominal wall w/ high grade bowel obstruction. Will consult to surgery. [HN]  1429 Gen surg aware, will come to evaluate patient. Consulted to medicine for admission. [HN]  1429 CT ABDOMEN PELVIS W  CONTRAST Fascial defect in the anterior abdominal wall with herniated small bowel resulting in small-bowel obstruction. The hernia also contains mildly free fluid. Surgical consultation is recommended.   [HN]  1441 D/w Dr. Freida Busman who states that her hernia has recurred, that she had a complicated surgery, she would be better served by going back to [facility]. D/w secretary for consult to get surg at [facility] for transfer [HN]  1524 Urinalysis, Routine w reflex microscopic -Urine, Clean Catch(!) Mild ketonuria, proteinuria, WBC/RBCs. 11-20 squam. Will not treat for UTI at this time. [HN]  1524 Patient is signed out to the oncoming ED physician Dr. Freida Busman who is made aware of her history, presentation, exam, workup, and plan.  Awaiting call from [facility] for transfer.  [HN]    Clinical Course User Index [HN] Loetta Rough, MD    Labs: I Ordered, and personally interpreted labs.  The pertinent results include:  those listed above  Imaging Studies ordered: I ordered imaging studies including CT abd pelvis I independently visualized and interpreted imaging. I agree  with the radiologist interpretation  Additional history obtained from chart review.    Cardiac Monitoring: The patient was maintained on a cardiac monitor.  I personally viewed and interpreted the cardiac monitored which showed an underlying rhythm of: NSR  Reevaluation: After the interventions noted above, I reevaluated the patient and found that they have :improved  Social Determinants of Health:  patient lives independently and has 2 children at home  Disposition:  Pending call back from [facility], signed out to oncoming EDP Dr. Freida Busman  Co morbidities that complicate the patient evaluation  Past Medical History:  Diagnosis Date   Gestational diabetes    History of female hirsutism    Obesity    PCOS (polycystic ovarian syndrome)      Medicines Meds ordered this encounter  Medications    ondansetron (ZOFRAN) injection 4 mg   lactated ringers bolus 1,000 mL   iohexol (OMNIPAQUE) 300 MG/ML solution 100 mL   HYDROmorphone (DILAUDID) injection 0.5 mg   promethazine (PHENERGAN) 12.5 mg in sodium chloride 0.9 % 50 mL IVPB    I have reviewed the patients home medicines and have made adjustments as needed  Problem List / ED Course: Problem List Items Addressed This Visit   None Visit Diagnoses     Hernia of abdominal cavity, with obstruction    -  Primary                   This note was created using dictation software, which may contain spelling or grammatical errors.    Loetta Rough, MD 02/03/23 [phone]

## 2023-02-19 ENCOUNTER — Emergency Department (HOSPITAL_COMMUNITY)
Admission: EM | Admit: 2023-02-19 | Discharge: 2023-02-19 | Disposition: A | Discharge status: Home / Self Care | Payer: Medicaid Other | Attending: Emergency Medicine | Admitting: Emergency Medicine

## 2023-02-19 ENCOUNTER — Encounter (HOSPITAL_COMMUNITY): Payer: Self-pay

## 2023-02-19 ENCOUNTER — Emergency Department (HOSPITAL_COMMUNITY): Payer: Medicaid Other

## 2023-02-19 ENCOUNTER — Other Ambulatory Visit: Payer: Self-pay

## 2023-02-19 DIAGNOSIS — R1011 Right upper quadrant pain: Secondary | ICD-10-CM | POA: Diagnosis present

## 2023-02-19 DIAGNOSIS — R1084 Generalized abdominal pain: Secondary | ICD-10-CM | POA: Diagnosis not present

## 2023-02-19 DIAGNOSIS — R112 Nausea with vomiting, unspecified: Secondary | ICD-10-CM | POA: Diagnosis not present

## 2023-02-19 DIAGNOSIS — R110 Nausea: Secondary | ICD-10-CM

## 2023-02-19 LAB — I-STAT BETA HCG BLOOD, ED (MC, WL, AP ONLY): I-stat hCG, quantitative: <5 m[IU]/mL (ref <5)

## 2023-02-19 LAB — URINALYSIS, ROUTINE W REFLEX MICROSCOPIC
Bilirubin Urine: NEGATIVE
Glucose, UA: NEGATIVE mg/dL
Hgb urine dipstick: NEGATIVE
Ketones, ur: NEGATIVE mg/dL
Leukocytes,Ua: NEGATIVE
Nitrite: NEGATIVE
Protein, ur: NEGATIVE mg/dL
Specific Gravity, Urine: 1.014 (ref 1.005–1.030)
pH: 7 (ref 5.0–8.0)

## 2023-02-19 LAB — COMPREHENSIVE METABOLIC PANEL
ALT: 13 U/L (ref 0–44)
AST: 19 U/L (ref 15–41)
Albumin: 4 g/dL (ref 3.5–5.0)
Alkaline Phosphatase: 67 U/L (ref 38–126)
Anion gap: 8 (ref 5–15)
BUN: 11 mg/dL (ref 6–20)
CO2: 23 mmol/L (ref 22–32)
Calcium: 8.5 mg/dL — ABNORMAL LOW (ref 8.9–10.3)
Chloride: 101 mmol/L (ref 98–111)
Creatinine, Ser: 0.87 mg/dL (ref 0.44–1.00)
GFR, Estimated: >60 mL/min (ref >60)
Glucose, Bld: 136 mg/dL — ABNORMAL HIGH (ref 70–99)
Potassium: 3.5 mmol/L (ref 3.5–5.1)
Sodium: 132 mmol/L — ABNORMAL LOW (ref 135–145)
Total Bilirubin: 0.5 mg/dL (ref 0.3–1.2)
Total Protein: 7.8 g/dL (ref 6.5–8.1)

## 2023-02-19 LAB — CBC
HCT: 39.8 % (ref 36.0–46.0)
Hemoglobin: 12.6 g/dL (ref 12.0–15.0)
MCH: 27.5 pg (ref 26.0–34.0)
MCHC: 31.7 g/dL (ref 30.0–36.0)
MCV: 86.9 fL (ref 80.0–100.0)
Platelets: 316 10*3/uL (ref 150–400)
RBC: 4.58 MIL/uL (ref 3.87–5.11)
RDW: 13.1 % (ref 11.5–15.5)
WBC: 6.7 10*3/uL (ref 4.0–10.5)
nRBC: 0 % (ref 0.0–0.2)

## 2023-02-19 LAB — LIPASE, BLOOD: Lipase: 25 U/L (ref 11–51)

## 2023-02-19 MED ORDER — ONDANSETRON 4 MG PO TBDP: 4.0000 mg | ORAL_TABLET | Freq: Three times a day (TID) | ORAL | 0 refills | Status: AC | PRN

## 2023-02-19 MED ORDER — IOHEXOL 300 MG/ML  SOLN
100.0000 mL | Freq: Once | INTRAMUSCULAR | Status: AC | PRN
  Administered 2023-02-19: 100 mL via INTRAVENOUS

## 2023-02-19 MED ORDER — ONDANSETRON HCL 4 MG/2ML IJ SOLN
4.0000 mg | Freq: Once | INTRAMUSCULAR | Status: AC
  Administered 2023-02-19: 4 mg via INTRAVENOUS
  Filled 2023-02-19: qty 2

## 2023-02-19 MED ORDER — MORPHINE SULFATE (PF) 4 MG/ML IV SOLN
4.0000 mg | Freq: Once | INTRAVENOUS | Status: AC
  Administered 2023-02-19: 4 mg via INTRAVENOUS
  Filled 2023-02-19: qty 1

## 2023-02-19 MED ORDER — SODIUM CHLORIDE 0.9 % IV BOLUS
500.0000 mL | Freq: Once | INTRAVENOUS | Status: AC
  Administered 2023-02-19: 500 mL via INTRAVENOUS

## 2023-02-19 NOTE — Discharge Instructions (Signed)

## 2023-02-19 NOTE — ED Provider Notes (Signed)
Emergency Department Provider Note   I have reviewed the triage vital signs and the nursing notes.   HISTORY  Chief Complaint Abdominal Pain   HPI Danielle Huerta is a 47 y.o. female with PSH of small bowel obstruction with recurrent incisional hernia repaired on May 10th presents to the emergency department with return of abdominal pain along with nausea and vomiting.  Symptoms began yesterday and have persisted.  No fevers or chills.  No chest pain or shortness of breath.  Her incision is healing well after surgery and denies any significant discharge or bleeding from the area.  Most of her pain is in the right upper abdomen at this time. Initial umbilical hernia was repaired on 01/13/23 at [facility] and revised on 02/03/23.    Past Medical History:  Diagnosis Date   Gestational diabetes    History of female hirsutism    Obesity    PCOS (polycystic ovarian syndrome)     Review of Systems  Constitutional: No fever/chills Cardiovascular: Denies chest pain. Respiratory: Denies shortness of breath. Gastrointestinal: Positive abdominal pain.  Positive nausea and vomiting.  No diarrhea.   Genitourinary: Negative for dysuria. Musculoskeletal: Negative for back pain. Skin: Negative for rash. Neurological: Negative for headaches.   ____________________________________________   PHYSICAL EXAM:  VITAL SIGNS: ED Triage Vitals  Enc Vitals Group     BP 02/19/23 0512 (!) 184/117     Pulse Rate 02/19/23 0512 87     Resp 02/19/23 0512 17     Temp 02/19/23 0512 98.7 F (37.1 C)     Temp Source 02/19/23 0512 Oral     SpO2 02/19/23 0512 99 %     Weight 02/19/23 0512 235 lb (106.6 kg)     Height 02/19/23 0512 5\' 6"  (1.676 m)   Constitutional: Alert and oriented. Well appearing and in no acute distress. Eyes: Conjunctivae are normal. Head: Atraumatic. Nose: No congestion/rhinnorhea. Mouth/Throat: Mucous membranes are moist.  Neck: No stridor. Cardiovascular: Normal  rate, regular rhythm. Good peripheral circulation. Grossly normal heart sounds.   Respiratory: Normal respiratory effort.  No retractions. Lungs CTAB. Gastrointestinal: Soft with focal tenderness in the right upper quadrant.  The midline abdominal incision is well-appearing with some overlying glue in place. No erythema or drainage.  Musculoskeletal: No gross deformities of extremities. Neurologic:  Normal speech and language.  Skin:  Skin is warm, dry and intact. No rash noted.   ____________________________________________   LABS (all labs ordered are listed, but only abnormal results are displayed)  Labs Reviewed  COMPREHENSIVE METABOLIC PANEL - Abnormal; Notable for the following components:      Result Value   Sodium 132 (*)    Glucose, Bld 136 (*)    Calcium 8.5 (*)    All other components within normal limits  URINALYSIS, ROUTINE W REFLEX MICROSCOPIC - Abnormal; Notable for the following components:   APPearance CLOUDY (*)    All other components within normal limits  LIPASE, BLOOD  CBC  I-STAT BETA HCG BLOOD, ED ([facility], [facility], [facility] ONLY)   ____________________________________________  RADIOLOGY  CT ABDOMEN PELVIS W CONTRAST  Result Date: 02/19/2023 CLINICAL DATA:  Recent abdominal surgery. Back pain with nausea and vomiting, evaluate for bowel obstruction EXAM: CT ABDOMEN AND PELVIS WITH CONTRAST TECHNIQUE: Multidetector CT imaging of the abdomen and pelvis was performed using the standard protocol following bolus administration of intravenous contrast. RADIATION DOSE REDUCTION: This exam was performed according to the departmental dose-optimization program which includes automated exposure control, adjustment  of the mA and/or kV according to patient size and/or use of iterative reconstruction technique. CONTRAST:  OMNIPAQUE IOHEXOL 300 MG/ML  SOLN COMPARISON:  02/03/2023 FINDINGS: Lower chest:  No contributory findings. Hepatobiliary: No focal liver abnormality.Calcified  gallstone at the neck. No acute inflammation. No biliary dilatation. Pancreas: Unremarkable. Spleen: Unremarkable. Adrenals/Urinary Tract: Negative adrenals. No hydronephrosis or stone. Unremarkable bladder. Stomach/Bowel: No recurrent bowel obstruction. At the lower laparotomy incision there is a nondistended fluid collection likely in the extraperitoneal space which measures up to 9 cm craniocaudal and 2.3 cm in thickness. Vascular/Lymphatic: No acute vascular abnormality. No mass or adenopathy. Reproductive:Bilateral ovarian cysts, on the right measuring up to 5.6 cm. On the left there is a somewhat tubular shaped suggesting hydrosalpinx. The right cyst has grown since recent comparison suggesting a follicular process. No noted internal complexity or parenchymal edema. Hysterectomy Other: No ascites or pneumoperitoneum. Musculoskeletal: No acute abnormalities. IMPRESSION: 1. No recurrent bowel obstruction. Partial collapse of extraperitoneal fluid collection at the midline laparotomy site. 2. 5.6 cm right ovarian cyst since 02/03/2023. Suspect chronic hydrosalpinx on the left. 3. Cholelithiasis. Electronically Signed   By: Tiburcio Pea M.D.   On: 02/19/2023 08:11    ____________________________________________   PROCEDURES  Procedure(s) performed:   Procedures  None  ____________________________________________   INITIAL IMPRESSION / ASSESSMENT AND PLAN / ED COURSE  Pertinent labs & imaging results that were available during my care of the patient were reviewed by me and considered in my medical decision making (see chart for details).   This patient is Presenting for Evaluation of abdominal pain, which does require a range of treatment options, and is a complaint that involves a high risk of morbidity and mortality.  The Differential Diagnoses includes but is not exclusive to acute cholecystitis, intrathoracic causes for epigastric abdominal pain, gastritis, duodenitis, pancreatitis,  small bowel or large bowel obstruction, abdominal aortic aneurysm, hernia, gastritis, etc.   Critical Interventions-    Medications  sodium chloride 0.9 % bolus 500 mL (500 mLs Intravenous New Bag/Given 02/19/23 0805)  ondansetron (ZOFRAN) injection 4 mg (4 mg Intravenous Given 02/19/23 0806)  morphine (PF) 4 MG/ML injection 4 mg (4 mg Intravenous Given 02/19/23 0806)  iohexol (OMNIPAQUE) 300 MG/ML solution 100 mL (100 mLs Intravenous Contrast Given 02/19/23 0728)    Reassessment after intervention:  symptoms improved.   I decided to review pertinent External Data, and in summary reviewed op-notes and discharge summary from May 2024 in Care Everywhere.    Clinical Laboratory Tests Ordered, included CMP without acute kidney injury.  Normal LFTs.  Lipase normal.  CBC without leukocytosis. UA negative.   Radiologic Tests Ordered, included CT abdomen/pelvis. I independently interpreted the images and agree with radiology interpretation.   Cardiac Monitor Tracing which shows NSR.    Social Determinants of Health Risk patient is not an active smoker.    Medical Decision Making: Summary:  Patient presents emergency department for evaluation of abdominal pain and vomiting.  Has history of recurrent incisional hernia and bowel obstruction related to this.  Plan for CT imaging to reassess for this that she has had multiple issues with this in the recent past.   Reevaluation with update and discussion with patient to review CT findings. Plan for Zofran PRN at home and will continue to manage pain with available meds at home. Discussed ED return precautions. Considered admission but symptoms are improved and no acute surgical process in the abdomen.     Patient's presentation is most consistent with  acute presentation with potential threat to life or bodily function.   Disposition: discharge  ____________________________________________  FINAL CLINICAL IMPRESSION(S) / ED DIAGNOSES  Final  diagnoses:  Generalized abdominal pain  Nausea  Nausea and vomiting, unspecified vomiting type     NEW OUTPATIENT MEDICATIONS STARTED DURING THIS VISIT:  New Prescriptions   ONDANSETRON (ZOFRAN-ODT) 4 MG DISINTEGRATING TABLET    Take 1 tablet (4 mg total) by mouth every 8 (eight) hours as needed for nausea or vomiting.    Note:  This document was prepared using Dragon voice recognition software and may include unintentional dictation errors.  Alona Bene, MD, [facility] Emergency Medicine    Shadee Montoya, Arlyss Repress, MD 02/19/23 [phone]

## 2023-02-19 NOTE — ED Triage Notes (Signed)
Pt. Arrives POV c/o abdominal pain that radiates to the back, nausea and vomiting. Pt. States that she had abdominal surgery May 10, and was told to come back to the hospital if she was having a similar pain.

## 2023-03-23 ENCOUNTER — Emergency Department (HOSPITAL_COMMUNITY): Payer: Medicaid Other

## 2023-03-23 ENCOUNTER — Encounter (HOSPITAL_COMMUNITY): Payer: Self-pay | Admitting: Emergency Medicine

## 2023-03-23 ENCOUNTER — Emergency Department (HOSPITAL_COMMUNITY)
Admission: EM | Admit: 2023-03-23 | Discharge: 2023-03-24 | Disposition: A | Discharge status: Home / Self Care | Payer: Medicaid Other | Attending: Emergency Medicine | Admitting: Emergency Medicine

## 2023-03-23 DIAGNOSIS — K8050 Calculus of bile duct without cholangitis or cholecystitis without obstruction: Secondary | ICD-10-CM | POA: Insufficient documentation

## 2023-03-23 DIAGNOSIS — R1011 Right upper quadrant pain: Secondary | ICD-10-CM | POA: Diagnosis present

## 2023-03-23 LAB — URINALYSIS, ROUTINE W REFLEX MICROSCOPIC
Bilirubin Urine: NEGATIVE
Glucose, UA: NEGATIVE mg/dL
Hgb urine dipstick: NEGATIVE
Ketones, ur: NEGATIVE mg/dL
Leukocytes,Ua: NEGATIVE
Nitrite: NEGATIVE
Protein, ur: NEGATIVE mg/dL
Specific Gravity, Urine: 1.009 (ref 1.005–1.030)
pH: 8 (ref 5.0–8.0)

## 2023-03-23 MED ORDER — LACTATED RINGERS IV BOLUS
1000.0000 mL | Freq: Once | INTRAVENOUS | Status: AC
  Administered 2023-03-23: 1000 mL via INTRAVENOUS

## 2023-03-23 MED ORDER — ONDANSETRON HCL 4 MG/2ML IJ SOLN
4.0000 mg | Freq: Once | INTRAMUSCULAR | Status: AC
  Administered 2023-03-23: 4 mg via INTRAVENOUS
  Filled 2023-03-23: qty 2

## 2023-03-23 MED ORDER — MORPHINE SULFATE (PF) 4 MG/ML IV SOLN
4.0000 mg | Freq: Once | INTRAVENOUS | Status: AC
  Administered 2023-03-23: 4 mg via INTRAVENOUS
  Filled 2023-03-23: qty 1

## 2023-03-23 NOTE — ED Triage Notes (Signed)
Pt has surgery for gallbladder in August scheduled. Here tonight for pain and nausea and vomiting. She doesn't think she can wait on her surgery. She has hx of hernia repair and bowel obstruction 6 weeks ago. Reports passing gas and having BM but just slow since surgery.

## 2023-03-23 NOTE — ED Provider Notes (Signed)
[place] EMERGENCY DEPARTMENT AT [facility] Provider Note   CSN: [id number] Arrival date & time: 03/23/23  2242     History  Chief Complaint  Patient presents with   Abdominal Pain    Danielle Huerta is a 47 y.o. female.  Patient here with upper abdominal pain since this evening.  She is concerned about her gallbladder has known gallstones.  She is scheduled for cholecystectomy next month.  She has upper abdominal pain with nausea and vomiting tonight x 2.  Pain has been intermittent for several weeks.  She had an incisional hernia repair with bowel obstruction on May 10 done at [facility].  States has been well from that standpoint and had a postoperative appointment 2 days ago.  States she is passing gas.  Has not had a fever.  No chest pain or shortness of breath.  Blood pressure today is elevated and she states compliance with her blood pressure medications.  Pain is to her right upper quadrant and epigastrium and feels similar to previous gallstone type pain not controlled with her home ibuprofen. Her initial surgery was in April and she had a second surgery in May for a bowel obstruction.  Surgery clinic visit 03/21/23. Assessment:  S/p robotic umbilical hernia repair via eTEP on 01/13/23 S/p open repair of recurrent incarcerated incisional hernia repair on 02/03/23 Probable symptomatic cholelithiasis vs chronic cholecystitis.    The history is provided by the patient.  Abdominal Pain Associated symptoms: nausea and vomiting   Associated symptoms: no cough, no dysuria, no fever, no hematuria and no shortness of breath        Home Medications Prior to Admission medications   Medication Sig Start Date End Date Taking? Authorizing Provider  acetaminophen (TYLENOL) 500 MG tablet Take 1 tablet (500 mg total) by mouth every 6 (six) hours as needed for moderate pain. Patient not taking: Reported on 03/04/2020 04/02/16   Shea Evans, MD  amLODipine  (NORVASC) 5 MG tablet Take 5 mg by mouth in the morning and at bedtime.    [provider]  ibuprofen (ADVIL,MOTRIN) 600 MG tablet Take 1 tablet (600 mg total) by mouth every 8 (eight) hours as needed. Patient not taking: Reported on 03/04/2020 11/30/16   Loletta Specter, PA-C  Ibuprofen-Acetaminophen (ADVIL DUAL ACTION) 125-250 MG TABS Take 1 tablet by mouth 2 (two) times daily as needed (cold symptoms).    [provider]  ondansetron (ZOFRAN-ODT) 4 MG disintegrating tablet Take 1 tablet (4 mg total) by mouth every 8 (eight) hours as needed for nausea or vomiting. 02/19/23   Long, Arlyss Repress, MD  predniSONE (DELTASONE) 20 MG tablet Take 3 tablets (60 mg total) by mouth daily with breakfast. Patient not taking: Reported on 03/04/2020 06/22/17   Loletta Specter, PA-C      Allergies    Amoxicillin    Review of Systems   Review of Systems  Constitutional:  Negative for activity change, appetite change and fever.  HENT:  Negative for congestion and rhinorrhea.   Eyes:  Negative for visual disturbance.  Respiratory:  Negative for cough, chest tightness and shortness of breath.   Gastrointestinal:  Positive for abdominal pain, nausea and vomiting.  Genitourinary:  Negative for dysuria and hematuria.  Musculoskeletal:  Positive for back pain. Negative for arthralgias and myalgias.  Skin:  Negative for rash.  Neurological:  Positive for weakness. Negative for headaches.   all other systems are negative except as noted in the HPI  and PMH.    Physical Exam Updated Vital Signs BP (!) 193/109 (BP Location: Right Arm)   Pulse 75   Temp 97.8 F (36.6 C) (Oral)   Resp 18   LMP 02/04/2020   SpO2 100%  Physical Exam Vitals and nursing note reviewed.  Constitutional:      General: She is not in acute distress.    Appearance: She is well-developed.  HENT:     Head: Normocephalic and atraumatic.     Mouth/Throat:     Pharynx: No oropharyngeal exudate.  Eyes:      Conjunctiva/sclera: Conjunctivae normal.     Pupils: Pupils are equal, round, and reactive to light.  Neck:     Comments: No meningismus. Cardiovascular:     Rate and Rhythm: Normal rate and regular rhythm.     Heart sounds: Normal heart sounds. No murmur heard. Pulmonary:     Effort: Pulmonary effort is normal. No respiratory distress.     Breath sounds: Normal breath sounds.  Abdominal:     Palpations: Abdomen is soft.     Tenderness: There is no abdominal tenderness. There is no guarding or rebound.     Comments: Well-healed midline abdominal incision without surrounding erythema or drainage.  Tender right upper quadrant and epigastric  Musculoskeletal:        General: No tenderness. Normal range of motion.     Cervical back: Normal range of motion and neck supple.  Skin:    General: Skin is warm.  Neurological:     Mental Status: She is alert and oriented to person, place, and time.     Cranial Nerves: No cranial nerve deficit.     Motor: No abnormal muscle tone.     Coordination: Coordination normal.     Comments: No ataxia on finger to nose bilaterally. No pronator drift. 5/5 strength throughout. CN 2-12 intact.Equal grip strength. Sensation intact.   Psychiatric:        Behavior: Behavior normal.     ED Results / Procedures / Treatments   Labs (all labs ordered are listed, but only abnormal results are displayed) Labs Reviewed  COMPREHENSIVE METABOLIC PANEL - Abnormal; Notable for the following components:      Result Value   Sodium 134 (*)    Potassium 3.3 (*)    Glucose, Bld 132 (*)    Calcium 8.7 (*)    All other components within normal limits  URINALYSIS, ROUTINE W REFLEX MICROSCOPIC - Abnormal; Notable for the following components:   Color, Urine STRAW (*)    All other components within normal limits  CBC WITH DIFFERENTIAL/PLATELET  LIPASE, BLOOD    EKG None  Radiology CT ABDOMEN PELVIS W CONTRAST  Result Date: 03/24/2023 CLINICAL DATA:  Nausea and  vomiting. EXAM: CT ABDOMEN AND PELVIS WITH CONTRAST TECHNIQUE: Multidetector CT imaging of the abdomen and pelvis was performed using the standard protocol following bolus administration of intravenous contrast. RADIATION DOSE REDUCTION: This exam was performed according to the departmental dose-optimization program which includes automated exposure control, adjustment of the mA and/or kV according to patient size and/or use of iterative reconstruction technique. CONTRAST:  OMNIPAQUE IOHEXOL 300 MG/ML  SOLN COMPARISON:  Feb 19, 2023 FINDINGS: Lower chest: Mild atelectasis is seen within the left lung base. Hepatobiliary: No focal liver abnormality is seen. An 18 mm gallstone is seen within the lumen of an otherwise normal-appearing gallbladder. There is no evidence of pericholecystic inflammation or biliary dilatation. Pancreas: Unremarkable. No pancreatic ductal dilatation or  surrounding inflammatory changes. Spleen: Normal in size without focal abnormality. Adrenals/Urinary Tract: Adrenal glands are unremarkable. Kidneys are normal, without renal calculi, focal lesion, or hydronephrosis. Bladder is unremarkable. Stomach/Bowel: Stomach is within normal limits. Appendix appears normal. No evidence of bowel wall thickening, distention, or inflammatory changes. Vascular/Lymphatic: No significant vascular findings are present. No enlarged abdominal or pelvic lymph nodes. Reproductive: The uterus is surgically absent. A stable 2.3 cm diameter simple cyst is seen within the anterior aspect of the left adnexa. 2.4 cm and 3.3 cm right adnexal cysts are seen. A 5.6 cm right adnexal cyst is noted within this region on the prior study. Other: A 1.9 cm x 0.4 cm fluid collection is seen along the midline laparotomy site. This is decreased in size when compared to the prior exam. No abdominopelvic ascites. Musculoskeletal: No acute or significant osseous findings. IMPRESSION: 1. Cholelithiasis without evidence of acute  cholecystitis. 2. Evidence of prior hysterectomy. 3. Bilateral adnexal cysts, as described above. No follow-up imaging is recommended. This recommendation follows ACR consensus guidelines: White Paper of the ACR Incidental Findings Committee II on Adnexal Findings. J Am Coll Radiol [phone]. 4. Small fluid collection along the midline laparotomy site, decreased in size when compared to the prior study. Electronically Signed   By: Aram Candela M.D.   On: 03/24/2023 00:52   US Abdomen Limited RUQ (LIVER/GB)  Result Date: 03/24/2023 CLINICAL DATA:  Right upper quadrant pain. EXAM: ULTRASOUND ABDOMEN LIMITED RIGHT UPPER QUADRANT COMPARISON:  None Available. FINDINGS: Gallbladder: A 1.7 cm x 0.5 cm x 1.2 cm shadowing echogenic gallstone is seen within the neck of the gallbladder. Gallbladder wall measures 2.8 mm in thickness. No sonographic Murphy sign noted by sonographer. Common bile duct: Diameter: 2.9 mm Liver: No focal lesion identified. Within normal limits in parenchymal echogenicity. Portal vein is patent on color Doppler imaging with normal direction of blood flow towards the liver. Other: Limited study as the patient was given pain medication prior to the exam. IMPRESSION: Cholelithiasis, without evidence of acute cholecystitis. Electronically Signed   By: Aram Candela M.D.   On: 03/24/2023 00:45    Procedures Procedures    Medications Ordered in ED Medications  lactated ringers bolus 1,000 mL (has no administration in time range)  ondansetron (ZOFRAN) injection 4 mg (has no administration in time range)  morphine (PF) 4 MG/ML injection 4 mg (has no administration in time range)    ED Course/ Medical Decision Making/ A&P                             Medical Decision Making Amount and/or Complexity of Data Reviewed Labs: ordered. Decision-making details documented in ED Course. Radiology: ordered and independent interpretation performed. Decision-making details documented  in ED Course. ECG/medicine tests: ordered and independent interpretation performed. Decision-making details documented in ED Course.  Risk Prescription drug management.   Upper abdominal pain with nausea and vomiting. Recent hernia repair and SBO surgery in May. Stable vitals. Abdomen soft without peritoneal signs.   Ultrasound shows evidence of gallstones without cholecystitis.  Labs show normal LFTs and lipase.  No leukocytosis.  CT scan does not show any unexpected postsurgical pathology.  Does show cholelithiasis without cholecystitis.  Known ovarian cysts.  Fluid collection is improved from CT scan last month.  Pain is controlled.  Patient tolerating p.o.  Discussed pain control for home, low-fat diet, follow-up with her surgeon for cholecystectomy as scheduled. Blood pressure has improved.  Return to the ED with worsening pain, fever, vomiting, unable to eat or drink or other concerns.        Final Clinical Impression(s) / ED Diagnoses Final diagnoses:  Biliary colic    Rx / DC Orders ED Discharge Orders     None         Tra Wilemon, Jeannett Senior, MD 03/24/23 0200

## 2023-03-24 ENCOUNTER — Emergency Department (HOSPITAL_COMMUNITY): Payer: Medicaid Other

## 2023-03-24 LAB — CBC WITH DIFFERENTIAL/PLATELET
Abs Immature Granulocytes: 0.01 10*3/uL (ref 0.00–0.07)
Basophils Absolute: 0 10*3/uL (ref 0.0–0.1)
Basophils Relative: 0 %
Eosinophils Absolute: 0.1 10*3/uL (ref 0.0–0.5)
Eosinophils Relative: 2 %
HCT: 37.9 % (ref 36.0–46.0)
Hemoglobin: 12.1 g/dL (ref 12.0–15.0)
Immature Granulocytes: 0 %
Lymphocytes Relative: 20 %
Lymphs Abs: 1 10*3/uL (ref 0.7–4.0)
MCH: 27.8 pg (ref 26.0–34.0)
MCHC: 31.9 g/dL (ref 30.0–36.0)
MCV: 86.9 fL (ref 80.0–100.0)
Monocytes Absolute: 0.4 10*3/uL (ref 0.1–1.0)
Monocytes Relative: 8 %
Neutro Abs: 3.5 10*3/uL (ref 1.7–7.7)
Neutrophils Relative %: 70 %
Platelets: 240 10*3/uL (ref 150–400)
RBC: 4.36 MIL/uL (ref 3.87–5.11)
RDW: 12.9 % (ref 11.5–15.5)
WBC: 5 10*3/uL (ref 4.0–10.5)
nRBC: 0 % (ref 0.0–0.2)

## 2023-03-24 LAB — COMPREHENSIVE METABOLIC PANEL
ALT: 13 U/L (ref 0–44)
AST: 18 U/L (ref 15–41)
Albumin: 4 g/dL (ref 3.5–5.0)
Alkaline Phosphatase: 67 U/L (ref 38–126)
Anion gap: 8 (ref 5–15)
BUN: 6 mg/dL (ref 6–20)
CO2: 27 mmol/L (ref 22–32)
Calcium: 8.7 mg/dL — ABNORMAL LOW (ref 8.9–10.3)
Chloride: 99 mmol/L (ref 98–111)
Creatinine, Ser: 0.77 mg/dL (ref 0.44–1.00)
GFR, Estimated: >60 mL/min (ref >60)
Glucose, Bld: 132 mg/dL — ABNORMAL HIGH (ref 70–99)
Potassium: 3.3 mmol/L — ABNORMAL LOW (ref 3.5–5.1)
Sodium: 134 mmol/L — ABNORMAL LOW (ref 135–145)
Total Bilirubin: 0.5 mg/dL (ref 0.3–1.2)
Total Protein: 8 g/dL (ref 6.5–8.1)

## 2023-03-24 LAB — LIPASE, BLOOD: Lipase: 27 U/L (ref 11–51)

## 2023-03-24 MED ORDER — IOHEXOL 300 MG/ML  SOLN
100.0000 mL | Freq: Once | INTRAMUSCULAR | Status: AC | PRN
  Administered 2023-03-24: 100 mL via INTRAVENOUS

## 2023-03-24 NOTE — ED Notes (Signed)
Pt. Assisted to the bathroom. Ambulatory with no assistance.

## 2023-03-24 NOTE — ED Notes (Signed)
Pt. Given ginger ale for fluid challenge 

## 2023-03-24 NOTE — Discharge Instructions (Signed)
Continue your pain medicine and nausea medicine as prescribed.  Avoid greasy or fatty meals.  Follow-up with your surgeon for your gallbladder surgery as scheduled.  Return to the ED with worsening pain, fever, vomiting, not able to eat or drink or other concerns.

## 2023-03-25 DIAGNOSIS — R0789 Other chest pain: Secondary | ICD-10-CM | POA: Insufficient documentation

## 2023-03-25 DIAGNOSIS — Z79899 Other long term (current) drug therapy: Secondary | ICD-10-CM | POA: Insufficient documentation

## 2023-03-25 DIAGNOSIS — I10 Essential (primary) hypertension: Secondary | ICD-10-CM | POA: Diagnosis not present

## 2023-03-25 DIAGNOSIS — R109 Unspecified abdominal pain: Secondary | ICD-10-CM | POA: Insufficient documentation

## 2023-03-26 ENCOUNTER — Emergency Department (HOSPITAL_COMMUNITY): Payer: Medicaid Other

## 2023-03-26 ENCOUNTER — Other Ambulatory Visit: Payer: Self-pay

## 2023-03-26 ENCOUNTER — Emergency Department (HOSPITAL_COMMUNITY)
Admission: EM | Admit: 2023-03-26 | Discharge: 2023-03-26 | Disposition: A | Discharge status: Other Acute Inpatient Hospital | Payer: Medicaid Other | Attending: Emergency Medicine | Admitting: Emergency Medicine

## 2023-03-26 DIAGNOSIS — K819 Cholecystitis, unspecified: Secondary | ICD-10-CM

## 2023-03-26 LAB — CBC WITH DIFFERENTIAL/PLATELET
Abs Immature Granulocytes: 0.02 10*3/uL (ref 0.00–0.07)
Basophils Absolute: 0 10*3/uL (ref 0.0–0.1)
Basophils Relative: 0 %
Eosinophils Absolute: 0.1 10*3/uL (ref 0.0–0.5)
Eosinophils Relative: 2 %
HCT: 39.1 % (ref 36.0–46.0)
Hemoglobin: 13 g/dL (ref 12.0–15.0)
Immature Granulocytes: 0 %
Lymphocytes Relative: 13 %
Lymphs Abs: 0.8 10*3/uL (ref 0.7–4.0)
MCH: 28.8 pg (ref 26.0–34.0)
MCHC: 33.2 g/dL (ref 30.0–36.0)
MCV: 86.7 fL (ref 80.0–100.0)
Monocytes Absolute: 0.3 10*3/uL (ref 0.1–1.0)
Monocytes Relative: 6 %
Neutro Abs: 4.9 10*3/uL (ref 1.7–7.7)
Neutrophils Relative %: 79 %
Platelets: 264 10*3/uL (ref 150–400)
RBC: 4.51 MIL/uL (ref 3.87–5.11)
RDW: 12.6 % (ref 11.5–15.5)
WBC: 6.1 10*3/uL (ref 4.0–10.5)
nRBC: 0 % (ref 0.0–0.2)

## 2023-03-26 LAB — COMPREHENSIVE METABOLIC PANEL
ALT: 12 U/L (ref 0–44)
AST: 23 U/L (ref 15–41)
Albumin: 4 g/dL (ref 3.5–5.0)
Alkaline Phosphatase: 62 U/L (ref 38–126)
Anion gap: 8 (ref 5–15)
BUN: 6 mg/dL (ref 6–20)
CO2: 27 mmol/L (ref 22–32)
Calcium: 8.7 mg/dL — ABNORMAL LOW (ref 8.9–10.3)
Chloride: 100 mmol/L (ref 98–111)
Creatinine, Ser: 0.85 mg/dL (ref 0.44–1.00)
GFR, Estimated: >60 mL/min (ref >60)
Glucose, Bld: 138 mg/dL — ABNORMAL HIGH (ref 70–99)
Potassium: 3.8 mmol/L (ref 3.5–5.1)
Sodium: 135 mmol/L (ref 135–145)
Total Bilirubin: 0.8 mg/dL (ref 0.3–1.2)
Total Protein: 8.2 g/dL — ABNORMAL HIGH (ref 6.5–8.1)

## 2023-03-26 LAB — TROPONIN I (HIGH SENSITIVITY)
Troponin I (High Sensitivity): 64 ng/L — ABNORMAL HIGH (ref <18)
Troponin I (High Sensitivity): 3 ng/L (ref <18)
Troponin I (High Sensitivity): 3 ng/L (ref <18)

## 2023-03-26 LAB — LIPASE, BLOOD: Lipase: 24 U/L (ref 11–51)

## 2023-03-26 LAB — HCG, SERUM, QUALITATIVE: Preg, Serum: NEGATIVE

## 2023-03-26 MED ORDER — ONDANSETRON HCL 4 MG/2ML IJ SOLN
4.0000 mg | Freq: Once | INTRAMUSCULAR | Status: AC
  Administered 2023-03-26: 4 mg via INTRAVENOUS
  Filled 2023-03-26: qty 2

## 2023-03-26 MED ORDER — HYDROMORPHONE HCL 1 MG/ML IJ SOLN
1.0000 mg | Freq: Once | INTRAMUSCULAR | Status: AC
  Administered 2023-03-26: 1 mg via INTRAVENOUS
  Filled 2023-03-26: qty 1

## 2023-03-26 MED ORDER — MORPHINE SULFATE (PF) 4 MG/ML IV SOLN
4.0000 mg | Freq: Once | INTRAVENOUS | Status: AC
  Administered 2023-03-26: 4 mg via INTRAVENOUS
  Filled 2023-03-26: qty 1

## 2023-03-26 MED ORDER — SODIUM CHLORIDE 0.9 % IV SOLN
2.0000 g | Freq: Once | INTRAVENOUS | Status: AC
  Administered 2023-03-26: 2 g via INTRAVENOUS
  Filled 2023-03-26: qty 20

## 2023-03-26 MED ORDER — IOHEXOL 350 MG/ML SOLN
100.0000 mL | Freq: Once | INTRAVENOUS | Status: AC | PRN
  Administered 2023-03-26: 100 mL via INTRAVENOUS

## 2023-03-26 MED ORDER — SODIUM CHLORIDE (PF) 0.9 % IJ SOLN
INTRAMUSCULAR | Status: AC
  Filled 2023-03-26: qty 50

## 2023-03-26 MED ORDER — METOCLOPRAMIDE HCL 5 MG/ML IJ SOLN
10.0000 mg | Freq: Once | INTRAMUSCULAR | Status: AC
  Administered 2023-03-26: 10 mg via INTRAVENOUS
  Filled 2023-03-26: qty 2

## 2023-03-26 NOTE — ED Notes (Signed)
Spoke with RN from [facility] and gave report on patient.

## 2023-03-26 NOTE — ED Notes (Signed)
[facility] bed 7 south room 731 # for report [phone]

## 2023-03-26 NOTE — ED Provider Notes (Signed)
[place] EMERGENCY DEPARTMENT AT [facility] Provider Note   CSN: [id number] Arrival date & time: 03/25/23  2354     History  Chief Complaint  Patient presents with   Abdominal Pain   Danielle Huerta is a 47 y.o. female with known call lithiasis and frequent biliary colic who presents today with worsening of her abdominal pain.  Patient has cholecystectomy scheduled in August of this year at [facility]  Per chart review had hernia repair with Dr. Cliffton Asters at [facility] in April, subsequently was found to have SBO in May of this year and transferred back to [facility] for repair by her surgeons there.  Today she reports that pain is also in her chest and is describing the pain as ripping pain from her upper abdomen through her back.  No shortness of breath or syncope.  She is not anticoagulated.  I personally reviewed her medical records.  History of hypertension.  HPI     Home Medications Prior to Admission medications   Medication Sig Start Date End Date Taking? Authorizing Provider  famotidine (PEPCID) 40 MG tablet Take 1 tablet by mouth daily. 01/25/23 01/25/24 Yes [provider]  Ibuprofen-Acetaminophen (ADVIL DUAL ACTION) 125-250 MG TABS Take 1 tablet by mouth 2 (two) times daily as needed (cold symptoms).   Yes [provider]  labetalol (NORMODYNE) 200 MG tablet Take by mouth. 12/09/21  Yes [provider]  ondansetron (ZOFRAN-ODT) 4 MG disintegrating tablet Take 1 tablet (4 mg total) by mouth every 8 (eight) hours as needed for nausea or vomiting. 02/19/23  Yes Long, Arlyss Repress, MD  oxyCODONE-acetaminophen (PERCOCET/ROXICET) 5-325 MG tablet Take 1 tablet by mouth every 4 (four) hours as needed for severe pain.   Yes [provider]  acetaminophen (TYLENOL) 500 MG tablet Take 1 tablet (500 mg total) by mouth every 6 (six) hours as needed for moderate pain. Patient not taking: Reported on 03/04/2020 04/02/16   Shea Evans, MD  ibuprofen  (ADVIL,MOTRIN) 600 MG tablet Take 1 tablet (600 mg total) by mouth every 8 (eight) hours as needed. Patient not taking: Reported on 03/04/2020 11/30/16   Loletta Specter, PA-C  predniSONE (DELTASONE) 20 MG tablet Take 3 tablets (60 mg total) by mouth daily with breakfast. Patient not taking: Reported on 03/04/2020 06/22/17   Loletta Specter, PA-C      Allergies    Amoxicillin    Review of Systems   Review of Systems  Constitutional:  Positive for appetite change.  HENT: Negative.    Cardiovascular:  Positive for chest pain. Negative for palpitations and leg swelling.  Gastrointestinal:  Positive for abdominal pain.  Musculoskeletal:  Positive for back pain.    Physical Exam Updated Vital Signs BP (!) 165/91   Pulse 84   Temp 98 F (36.7 C)   Resp 16   LMP 02/04/2020   SpO2 99%  Physical Exam Vitals and nursing note reviewed.  Constitutional:      Appearance: She is obese. She is not ill-appearing or toxic-appearing.  HENT:     Head: Normocephalic and atraumatic.     Mouth/Throat:     Mouth: Mucous membranes are moist.     Pharynx: No oropharyngeal exudate or posterior oropharyngeal erythema.  Eyes:     General:        Right eye: No discharge.        Left eye: No discharge.     Conjunctiva/sclera: Conjunctivae normal.  Cardiovascular:  Rate and Rhythm: Normal rate and regular rhythm.     Pulses: Normal pulses.     Heart sounds: Normal heart sounds. No murmur heard. Pulmonary:     Effort: Pulmonary effort is normal. No respiratory distress.     Breath sounds: Normal breath sounds. No wheezing or rales.  Abdominal:     General: Bowel sounds are normal. There is no distension.     Palpations: Abdomen is soft.     Tenderness: There is abdominal tenderness in the right upper quadrant and epigastric area. There is guarding. There is no right CVA tenderness, left CVA tenderness or rebound. Positive signs include Murphy's sign.  Musculoskeletal:        General: No  deformity.     Cervical back: Neck supple.  Skin:    General: Skin is warm and dry.  Neurological:     Mental Status: She is alert. Mental status is at baseline.  Psychiatric:        Mood and Affect: Mood normal.     ED Results / Procedures / Treatments   Labs (all labs ordered are listed, but only abnormal results are displayed) Labs Reviewed  COMPREHENSIVE METABOLIC PANEL - Abnormal; Notable for the following components:      Result Value   Glucose, Bld 138 (*)    Calcium 8.7 (*)    Total Protein 8.2 (*)    All other components within normal limits  TROPONIN I (HIGH SENSITIVITY) - Abnormal; Notable for the following components:   Troponin I (High Sensitivity) 64 (*)    All other components within normal limits  CBC WITH DIFFERENTIAL/PLATELET  LIPASE, BLOOD  HCG, SERUM, QUALITATIVE  TROPONIN I (HIGH SENSITIVITY)  TROPONIN I (HIGH SENSITIVITY)    EKG EKG Interpretation Date/Time:  Sunday March 26 2023 00:31:16 EDT Ventricular Rate:  75 PR Interval:  173 QRS Duration:  92 QT Interval:  396 QTC Calculation: 443 R Axis:   47  Text Interpretation: Sinus rhythm Probable left atrial enlargement ST elevation, consider inferior injury No significant change since last tracing Confirmed by Gilda Crease [phone]) on 03/26/2023 3:50:12 AM  Radiology CT Angio Chest/Abd/Pel for Dissection W and/or Wo Contrast  Result Date: 03/26/2023 CLINICAL DATA:  Worsening chest and abdomen pain. Known cholelithiasis. EXAM: CT ANGIOGRAPHY CHEST, ABDOMEN AND PELVIS TECHNIQUE: CT scan of the chest without contrast was initially performed to assess for thoracic aortic hematoma. Multidetector CT imaging through the chest, abdomen and pelvis was performed using the standard protocol during bolus administration of intravenous contrast. Multiplanar reconstructed images and MIPs were obtained and reviewed to evaluate the vascular anatomy. RADIATION DOSE REDUCTION: This exam was performed according to  the departmental dose-optimization program which includes automated exposure control, adjustment of the mA and/or kV according to patient size and/or use of iterative reconstruction technique. CONTRAST:  OMNIPAQUE IOHEXOL 350 MG/ML SOLN COMPARISON:  CT abdomen and pelvis with IV contrast dated 03/24/2023 and 02/19/2023. FINDINGS: CTA CHEST FINDINGS Cardiovascular: There is mild cardiomegaly again noted with a left chamber predominance. There are no visible coronary artery calcifications and no substantial pericardial effusion. The pulmonary arteries are normal in caliber without evidence of embolus to the segmental level. Pulmonary veins are nondistended. There is mild aortic tortuosity. There are no visible aortic plaques. There is no aortic aneurysm, stenosis or dissection. The great vessels are clear Mediastinum/Nodes: No enlarged mediastinal, hilar, or axillary lymph nodes. The lower poles of the thyroid gland, the trachea, and esophagus demonstrate no significant findings. There  is a small hiatal hernia. Lungs/Pleura: There is patchy airspace disease in the posterior segment of the right upper lobe. This is probably a small pneumonia but bronchoalveolar carcinoma could appear similar. A follow-up chest CT is recommended after treatment to ensure clearing. Remainder of the lungs are generally clear with again noted linear atelectasis or scarring in the left lower lobe. No lung nodules or further infiltrates are seen. There is no pleural effusion, thickening or pneumothorax. Musculoskeletal: There are mild degenerative changes of the thoracic spine. No acute osseous abnormality or aggressive lesion is evident. Review of the MIP images confirms the above findings. CTA ABDOMEN AND PELVIS FINDINGS VASCULAR Aorta: Normal. Celiac: Normal.  No branch occlusion. SMA: Normal.  No branch occlusion. Renals: Normal.  No branch occlusion. IMA: Normal.  No branch occlusion. Inflow: Patent without evidence of aneurysm,  dissection, vasculitis or significant stenosis. There is mild tortuosity of the right common iliac and both external iliac arteries. The visualized proximal outflow vessels are normal. Veins: Unopacified and not evaluated. Review of the MIP images confirms the above findings. NON-VASCULAR Hepatobiliary: The liver is homogeneous. No mass enhancement. A 1.9 cm stone is again noted in the proximal gallbladder, similar in position to the last study and may be impacted. The gallbladder is dilated up to 11 cm in length and there is increased gallbladder thickening and pericholecystic fluid concerning for acute cholecystitis. No biliary dilatation is seen. Pancreas: No abnormality. Spleen: No abnormality. Adrenals/Urinary Tract: No abnormality. Stomach/Bowel: Unremarkable stomach and unopacified small bowel. The appendix is normal in caliber. There is moderate retained stool ascending and transverse colon. Sigmoid diverticulosis. No bowel thickening or inflammatory changes. Lymphatic: No adenopathy. Reproductive: Homogeneous thin walled 2.7 cm dominant follicle left ovary, Hounsfield density is 9.5, stable. Abutting cysts in the right inferior again noted, 3.6 cm and 20 Hounsfield units on 11:220, and 2.7 cm and 14 Hounsfield units on 11:226. These are likewise unchanged since the recent previous study. Uterus is absent. Other: Trace cul-de-sac free fluid. This was present on both prior studies and could be due to leakage of cysts or physiologic. Likewise, a 3.9 x 1.8 cm fluid collection in the midline anterior abdomen at the laparotomy site is improved from 02/19/2023 but unchanged since 03/24/2023. Musculoskeletal: Chronic bilateral symmetric nonerosive sacroiliitis and mild degenerative changes of the lumbar spine. Review of the MIP images confirms the above findings. IMPRESSION: 1. No evidence of aortic aneurysm or dissection. 2. No evidence of pulmonary arterial embolus. 3. Cardiomegaly without CHF. 4. Patchy airspace  disease in the posterior segment of the right upper lobe, most likely a small pneumonia but bronchoalveolar carcinoma could appear similar. A follow-up chest CT is recommended after treatment to ensure clearing. 5. Distended gallbladder with increased gallbladder thickening and pericholecystic fluid concerning for acute cholecystitis. There is a 1.9 cm stone in the proximal gallbladder which may be impacted. 6. Constipation and diverticulosis. 7. Trace cul-de-sac free fluid which was present on both prior studies and could be due to leakage of ovarian cysts or physiologic. 8. Stable 3.9 x 1.8 cm fluid collection in the midline anterior abdomen at the laparotomy site, improved from 02/19/2023 but unchanged since 03/24/2023. 9. Bilateral symmetric nonerosive sacroiliitis. Electronically Signed   By: Almira Bar M.D.   On: 03/26/2023 05:16    Procedures Procedures    Medications Ordered in ED Medications  cefTRIAXone (ROCEPHIN) 2 g in sodium chloride 0.9 % 100 mL IVPB (2 g Intravenous New Bag/Given 03/26/23 0633)  ondansetron (ZOFRAN) injection  4 mg (4 mg Intravenous Given 03/26/23 0222)  morphine (PF) 4 MG/ML injection 4 mg (4 mg Intravenous Given 03/26/23 0221)  morphine (PF) 4 MG/ML injection 4 mg (4 mg Intravenous Given 03/26/23 0357)  metoCLOPramide (REGLAN) injection 10 mg (10 mg Intravenous Given 03/26/23 0356)  iohexol (OMNIPAQUE) 350 MG/ML injection 100 mL (100 mLs Intravenous Contrast Given 03/26/23 0421)    ED Course/ Medical Decision Making/ A&P Clinical Course as of 03/26/23 0645  Sun Mar 26, 2023  0350 Patient complaining of ripping pain from her upper abdomen through to her back, troponin elevated to 64.  CTA ordered. [RS]  0605   Patient will require consultation by general surgery, however given her recent procedures at outside institution was discussed with patient to remain in our facility for surgical consultation versus attempt to transfer back to her known surgical team.   Patient strong preference to transfer back to [facility].  Pending callback from their transfer line at this time. [RS]  [phone] Callback received from [facility] transfer line.  Patient follows with surgeons at [facility].  Secretary will attempt to contact these providers and call me back. [RS]  0623 I personally discussed antibiotic choice with pharmacist, Laney Potash, who states no visible administation of cephalosporins in the past, but low risk reaction to amoxicillin in the past. Recommends proceeding with rocephin with close monitoring in the ED. I appreciate her collaboration in the care of this patient. ED RN Efrain Sella informed of plan. [RS]  [phone] Patient's surgeon Dr. Cliffton Asters was on-call at [facility] and has accepted the patient back to his service.  Their transfer line will call our secretary when a bed is assigned, but she states there is a bed of currently available on the floor.  Appreciate their collaboration care of this patient. [RS]    Clinical Course User Index [RS] Joclyn Alsobrook, Eugene Gavia, PA-C                             Medical Decision Making 47 y/o female with CP and RUQ pain.    The differential diagnosis for RUQ includes but is not limited to:  Cholelithiasis / choledocholithiasis / cholecystitis / cholangitis, hepatitis (eg. viral, alcoholic, toxic),liver abscess, pancreatitis, liver / pancreatic / biliary tract cancer, ischemic hepatopathy (shock liver), hepatic vein obstruction (Budd-Chiari syndrome), liver cell adenoma, peptic ulcer disease (duodenal), functional or nonulcer dyspepsia, right lower lobe pneumonia, pyelonephritis, urinary calculi,  Fitz-Hugh-Curtis syndrome (with pelvic inflammatory disease), herpes zoster, trauma or musculoskeletal pain, herniated disk, abdominal abscess, intestinal ischemia, physical or sexual abuse, ectopic pregnancy, IUP, Mittelschmerz, ovarian cyst/torsion, threatened/ievitable abortion, PID, endometriosis, molar pregnancy, heterotopic pregnancy,  corpus luteum cyst, appendicitis, UTI/renal colic, IBD.    Amount and/or Complexity of Data Reviewed Labs: ordered.    Details: CBC without leukocytosis or anemia.  CMP unremarkable.  Pregnancy test negative, lipase is normal.  Initial troponin was elevated to 64.  This in combination with her concerning symptomatology prompted flexion of CT angiogram to rule out dissection or aneurysm.  CTA negative for PE or aneurysm/dissection over the aorta.  Patchy airspace disease in the posterior segment of the right upper lobe concerning for small pneumonia versus carcinoma.  Distended gallbladder with gallbladder wall thickening pericholecystic fluid concerning for acute cholecystitis.   Repeat troponins normal, 3. Repeated a third value which was also 3. Suspect lab error on initial elevated troponin.  Radiology: ordered.  Risk Prescription drug management.    Patient  accepted to [facility], Dr. Ulice Brilliant, her surgeon.  There is a bed available on the floor.  Awaiting secretary information for bed assignment.  Patient getting antibiotics and resting comfortably in her bed.  Awaiting transfer at time of shift change to morning team.  A.m. team made aware of patient's plan.  She is hemodynamically stable at this time.  This chart was dictated using voice recognition software, Dragon. Despite the best efforts of this provider to proofread and correct errors, errors may still occur which can change documentation meaning.   Final Clinical Impression(s) / ED Diagnoses Final diagnoses:  None    Rx / DC Orders ED Discharge Orders     None         Paris Lore, PA-C 03/26/23 0645    Gilda Crease, MD 03/27/23 [phone]

## 2023-03-26 NOTE — ED Notes (Signed)
Attempted to call report to receiving nurse and reports the were in shift change. Will call back

## 2023-03-26 NOTE — ED Notes (Signed)
EMTALA given to transport.

## 2023-03-26 NOTE — ED Notes (Signed)
[facility] ground called and I gave them report on patient. They said there ETA will be .

## 2023-03-26 NOTE — ED Triage Notes (Signed)
Patient coming to ED for evaluation of abdominal pain. Reports recently dx with gallstones.  Has surgery planned for August.  States pain has gotten worse tonight and "it is now causing severe pain in my chest."  States she is unable to tolerate Rx pain medication.  States "they told me I had a stone stuck and I am worried something is wrong."

## 2023-11-09 ENCOUNTER — Ambulatory Visit (HOSPITAL_COMMUNITY): Payer: Medicaid Other | Admitting: Licensed Clinical Social Worker

## 2023-11-09 DIAGNOSIS — F4323 Adjustment disorder with mixed anxiety and depressed mood: Secondary | ICD-10-CM

## 2023-11-09 NOTE — Progress Notes (Signed)
 Comprehensive Clinical Assessment (CCA) Note  11/09/2023 Danielle Huerta [id number]  Chief Complaint:  Chief Complaint  Patient presents with   Adjustment Disorder    Mother sick: cancer found out back July 2024. Pt also reports that sig other had a child without her knowledge.    Visit Diagnosis: Adjustment disorder with anxiety and depression  Virtual Visit via Video Note  I connected with Danielle Huerta on 11/09/23 at  1:00 PM EST by a video enabled telemedicine application and verified that I am speaking with the correct person using two identifiers.  Location: Patient: [facility] Provider: Provider's home office   I discussed the limitations of evaluation and management by telemedicine and the availability of in person appointments. The patient expressed understanding and agreed to proceed.  Client is a 48 year old  female. Client is referred by self for a Stress management.   Client states mental health symptoms as evidenced by:   Depression Hopelessness; Change in energy/activity; Irritability Hopelessness; Change in energy/activity; Irritability  Duration of Depressive Symptoms Greater than two weeks Greater than two weeks  Mania None None  Anxiety Tension; Worrying Tension; Worrying  Psychosis None None  Trauma None None  Obsessions None None  Compulsions None None  Inattention None None  Hyperactivity/Impulsivity None None  Oppositional/Defiant Behaviors None None  Emotional Irregularity None None        Client denies suicidal and homicidal ideations at this time  Client denies hallucinations and delusions at this time  Client was screened for the following SDOH: PHQ-9   Assessment Information that integrates subjective and objective details with a therapist's professional interpretation:    Patient was alert and oriented x 5.  She was pleasant, cooperative, and maintained good eye contact.  She engaged well in comprehensive clinical assessment.  She  presented today with depressed and anxious mood\affect.  Patient reports that she was a referral from primary care physician due to increased anxiety and depression.  Patient endorses symptoms listed above and states things have been hard due to stressors.  This is as evidenced by patient reporting stressors of mother's illness and infidelity by her partner for 15 years.  Patient reports in June 2024 her mother was diagnosed with cancer.  She reports that she is her mother's primary caregiver for all medical and home needs.  Patient reports that she has been trying to process her mother's diagnosis but it has been a struggle as she is very close with her mother.  Patient reports she is an only child and her mother's only medical support system.  Other stressors for patient are infidelity by her partner of 15 years.  Patient reports struggling after she found out 3 weeks ago that her partner has a 47-month-old child.  Patient reports that she found out finally after he came clean. Pt reports no problems in the relationship prior to this incident. She endorses symptoms for anger, irritability and tension.   Plan for pt is to engage in therapy for stress management. Pt would like to process through her stress and learn coping skills to better handle her depression and anxiety.   Client states use of the following substances: None reported  Clinician assisted client with scheduling the following appointments: March 13 at 11 AM virtual.   Client was in agreement with treatment recommendations.      I discussed the assessment and treatment plan with the patient. The patient was provided an opportunity to ask questions and all were answered. The patient agreed  with the plan and demonstrated an understanding of the instructions.   The patient was advised to call back or seek an in-person evaluation if the symptoms worsen or if the condition fails to improve as anticipated.  I provided 40 minutes of  non-face-to-face time during this encounter.   Weber Cooks, LCSW    CCA Screening, Triage and Referral (STR)  Patient Reported Information How did you hear about [place]? No data recorded Referral name: Dr. Virl Son  Referral phone number: No data recorded  Whom do you see for routine medical problems? Primary Care  Practice/Facility Name: [facility] facility  Practice/Facility Phone Number: No data recorded Name of Contact: Dr. Virl Son  Contact Number: No data recorded Contact Fax Number: No data recorded Prescriber Name: No data recorded Prescriber Address (if known): No data recorded  What Is the Reason for Your Visit/Call Today? No data recorded How Long Has This Been Causing You Problems? > than 6 months  What Do You Feel Would Help You the Most Today? Treatment for Depression or other mood problem; Stress Management   Have You Recently Been in Any Inpatient Treatment (Hospital/Detox/Crisis Center/28-Day Program)? No  Name/Location of Program/Hospital:No data recorded How Long Were You There? No data recorded When Were You Discharged? No data recorded  Have You Ever Received Services From [facility] Before? No  Who Do You See at [facility]? No data recorded  Have You Recently Had Any Thoughts About Hurting Yourself? No  Are You Planning to Commit Suicide/Harm Yourself At This time? No   Have you Recently Had Thoughts About Hurting Someone Karolee Ohs? No  Explanation: No data recorded  Have You Used Any Alcohol or Drugs in the Past 24 Hours? No  How Long Ago Did You Use Drugs or Alcohol? No data recorded What Did You Use and How Much? No data recorded  Do You Currently Have a Therapist/Psychiatrist? No  Name of Therapist/Psychiatrist: No data recorded  Have You Been Recently Discharged From Any Office Practice or Programs? No  Explanation of Discharge From Practice/Program: No data recorded    CCA Screening Triage Referral Assessment Type  of Contact: Tele-Assessment  Is this Initial or Reassessment? Initial Assessment  Date Telepsych consult ordered in [facility]:  11/09/23  Time Telepsych consult ordered in [facility]  [facility]11   Patient Reported Information Reviewed? No data recorded Patient Left Without Being Seen? No data recorded Reason for Not Completing Assessment: No data recorded  Collateral Involvement: No data recorded  Does Patient Have a Court Appointed Legal Guardian? No data recorded Name and Contact of Legal Guardian: No data recorded If Minor and Not Living with Parent(s), Who has Custody? No data recorded Is CPS involved or ever been involved? Never  Is APS involved or ever been involved? Never   Patient Determined To Be At Risk for Harm To Self or Others Based on Review of Patient Reported Information or Presenting Complaint? No  Method: No Plan  Availability of Means: No access or NA  Intent: Vague intent or NA  Notification Required: No need or identified person  Additional Information for Danger to Others Potential: No data recorded Additional Comments for Danger to Others Potential: No data recorded Are There Guns or Other Weapons in Your Home? No data recorded Types of Guns/Weapons: No data recorded Are These Weapons Safely Secured?  No data recorded Who Could Verify You Are Able To Have These Secured: No data recorded Do You Have any Outstanding Charges, Pending Court Dates, Parole/Probation? No data recorded Contacted To Inform of Risk of Harm To Self or Others: No data recorded  Location of Assessment: [facility]   Does Patient Present under Involuntary Commitment? No  IVC Papers Initial File Date: No data recorded  [place] of Residence: [place]   Patient Currently Receiving the Following Services: No data recorded  Determination of Need: No data recorded  Options For Referral: Outpatient Therapy     CCA Biopsychosocial Intake/Chief  Complaint:  Adjustment with processing her mother Dx of cancer and finding out her signifcant other had a baby without her knowledge. Pt reports that the child is 32 months old.  Current Symptoms/Problems: tension, worry, irritability, stress,   Patient Reported Schizophrenia/Schizoaffective Diagnosis in Past: No   Strengths: willing to engage i treatment  Preferences: therapy  Abilities: none reported   Type of Services Patient Feels are Needed: therapy   Initial Clinical Notes/Concerns: stress mgnt   Mental Health Symptoms Depression:  Hopelessness; Change in energy/activity; Irritability   Duration of Depressive symptoms: Greater than two weeks   Mania:  None   Anxiety:   Tension; Worrying   Psychosis:  None   Duration of Psychotic symptoms: No data recorded  Trauma:  None   Obsessions:  None   Compulsions:  None   Inattention:  None   Hyperactivity/Impulsivity:  None   Oppositional/Defiant Behaviors:  None   Emotional Irregularity:  None   Other Mood/Personality Symptoms:  No data recorded   Mental Status Exam Appearance and self-care  Stature:  Average   Weight:  Overweight   Clothing:  Casual   Grooming:  Well-groomed   Cosmetic use:  Age appropriate   Posture/gait:  Normal   Motor activity:  Not Remarkable   Sensorium  Attention:  Normal   Concentration:  Normal   Orientation:  X5   Recall/memory:  Normal   Affect and Mood  Affect:  Anxious   Mood:  Anxious   Relating  Eye contact:  None   Facial expression:  Anxious   Attitude toward examiner:  Cooperative   Thought and Language  Speech flow: Clear and Coherent   Thought content:  Appropriate to Mood and Circumstances   Preoccupation:  None   Hallucinations:  None   Organization:  No data recorded  [employer] of Knowledge:  Average   Intelligence:  Average   Abstraction:  Functional   Judgement:  Fair   Reality Testing:  Adequate    Insight:  Fair   Decision Making:  Normal   Social Functioning  Social Maturity:  Isolates   Social Judgement:  Normal   Stress  Stressors:  Relationship; Family conflict (Family conflict: Mother sick with Cancer Dx ack in June 2024)   Coping Ability:  Normal   Skill Deficits:  Self-care; Interpersonal   Supports:  Family; Friends/Service system     Religion: Religion/Spirituality Are You A Religious Person?: Yes What is Your Religious Affiliation?: Baptist How Might This Affect Treatment?: none reported  Leisure/Recreation: Leisure / Recreation Do You Have Hobbies?: No  Exercise/Diet: Exercise/Diet Do You Exercise?: Yes What Type of Exercise Do You Do?: Run/Walk, Other (Comment) (yoga, stretching) How Many Times a Week Do You Exercise?: 1-3 times a week Have You Gained or Lost A Significant Amount of Weight in the Past Six Months?: No Do You  Follow a Special Diet?: No Do You Have Any Trouble Sleeping?: No   CCA Employment/Education Employment/Work Situation: Employment / Work Situation Employment Situation: Employed 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?: No  Education: Education Is Patient Currently Attending School?: No Last Grade Completed: 12 Did Garment/textile technologist From [employer]?: Yes Did Theme park manager?: No Did You Attend Graduate School?: No Did You Have An Individualized Education Program (IIEP): Yes Did You Have Any Difficulty At School?: No Patient's Education Has Been Impacted by Current Illness: No   CCA Family/Childhood History Family and  Relationship History: Family history Marital status: Long term relationship Long term relationship, how long?: 15 years What types of issues is patient dealing with in the relationship?: Pt reports she recently found out he a 42 month old child that she did not know about until 3 weeks ago. Are you sexually active?: Yes What is your sexual orientation?: hetrsoexual Has your sexual activity been affected by drugs, alcohol, medication, or emotional stress?: none today Does patient have children?: Yes How many children?: 2 How is patient's relationship with their children?: 2 boys: "They are my world" oldest is 46 years old and youngest is 46 years old.  Childhood History:  Childhood History By whom was/is the patient raised?: Mother Additional childhood history information: Father in and out of her life Description of patient's relationship with caregiver when they were a child: MOther: Good Father: In and out of her life "it was okay" Patient's description of current relationship with people who raised him/her: Mother: Randie Heinz father: "Deceased" How were you disciplined when you got in trouble as a child/adolescent?: Punishment and negative reinformcment Does patient have siblings?: No Did patient suffer any verbal/emotional/physical/sexual abuse as a child?: No Did patient suffer from severe childhood neglect?: No Has patient ever been sexually abused/assaulted/raped as an adolescent or adult?: No Was the patient ever a victim of a crime or a disaster?: No Witnessed domestic violence?: No Has patient been affected by domestic violence as an adult?: No   CCA Substance Use Alcohol/Drug Use: Alcohol / Drug Use History of alcohol / drug use?: No history of alcohol / drug abuse       DSM5 Diagnoses: Patient Active Problem List   Diagnosis Date Noted   Adjustment disorder with mixed anxiety and depressed mood 11/09/2023   Umbilical hernia without obstruction and without gangrene  12/13/2022   Abnormal uterine bleeding (AUB) 12/21/2021   UTI symptoms 08/10/2020   PCOS (polycystic ovarian syndrome) 07/10/2019   Essential hypertension 06/18/2019   Obesity, Class III, BMI 40-49.9 (morbid obesity) (HCC) 11/05/2018   Prediabetes 11/05/2018   Gestational hypertension 08/18/2016   Postpartum care following cesarean delivery (11/22)  08/17/2016   Pregnancy diabetes 08/16/2016    Collaboration of Care: Other therapy at [facility]  Patient/Guardian was advised Release of Information must be obtained prior to any record release in order to collaborate their care with an outside provider. Patient/Guardian was advised if they have not already done so to contact the registration department to sign all necessary forms in order for [place] to  release information regarding their care.   Consent: Patient/Guardian gives verbal consent for treatment and assignment of benefits for services provided during this visit. Patient/Guardian expressed understanding and agreed to proceed.   Weber Cooks, LCSW

## 2023-12-07 ENCOUNTER — Ambulatory Visit (HOSPITAL_COMMUNITY): Payer: Medicaid Other | Admitting: Licensed Clinical Social Worker

## 2023-12-07 DIAGNOSIS — F4323 Adjustment disorder with mixed anxiety and depressed mood: Secondary | ICD-10-CM | POA: Diagnosis not present

## 2023-12-07 NOTE — Progress Notes (Signed)
 THERAPIST PROGRESS NOTE  Virtual Visit via Video Note  I connected with Danielle Huerta on 12/07/23 at 11:00 AM EDT by a video enabled telemedicine application and verified that I am speaking with the correct person using two identifiers.  Location: Patient: [facility]  Provider: Provider Home    I discussed the limitations of evaluation and management by telemedicine and the availability of in person appointments. The patient expressed understanding and agreed to proceed.     I discussed the assessment and treatment plan with the patient. The patient was provided an opportunity to ask questions and all were answered. The patient agreed with the plan and demonstrated an understanding of the instructions.   The patient was advised to call back or seek an in-person evaluation if the symptoms worsen or if the condition fails to improve as anticipated.  I provided 30 minutes of non-face-to-face time during this encounter.   Weber Cooks, LCSW   Participation Level: Active  Behavioral Response: CasualAlertAnxious and Depressed  Type of Therapy: Individual Therapy  Treatment Goals addressed:  Active     Anxiety     STG: Danielle Huerta will practice problem solving skills 3 times per week for the next 4 weeks.  (Progressing)     Start:  11/09/23    Expected End:  03/28/24         Identify 3 triggers for anxiety (Progressing)     Start:  11/09/23    Expected End:  03/28/24         Identify 3 coping skills for anxiety (Progressing)     Start:  11/09/23    Expected End:  03/28/24         Encourage Danielle Huerta to take psychotropic medication(s) as prescribed     Start:  11/09/23         Perform psychoeducation regarding anxiety disorders     Start:  11/09/23         Discuss risks and benefits of medication treatment options for this problem and prescribe as indicated (Completed)     Start:  11/09/23    End:  12/07/23      Work with Danielle Huerta to track symptoms,  triggers, and/or skill use through a mood chart, diary card, or journal (Completed)     Start:  11/09/23    End:  12/07/23         ProgressTowards Goals: Progressing  Interventions: CBT, Motivational Interviewing, and Supportive   Suicidal/Homicidal: Nowithout intent/plan  Therapist Response:     Patient was alert and oriented x 5.  She was pleasant, cooperative, and maintained good eye contact. Danielle Huerta engaged well in therapy session and was dressed casually.  Patient presented with depressed and anxious mood\affect.  Danielle Huerta reports today primary stressors as grief and loss, relationship, family conflict.  Patient reports today that she lost her best friend to a train accident.  She states today that her best friend and daughter who is 42 years old got hit by a train.  Patient reports that the 82 year old survived but the best friend did not.  This happened on February 21 which is the same day that her father passed away.  Patient reports that she is trying to process through the grief and loss but it has been difficult.  She feels like she cannot catch a break.Danielle Huerta reports that she is trying her best to be supportive of her goddaughter's who are her friends daughters. Patient reports she is concerned mostly for the 46 year old as the rest  are "grown".  Patient reports that the 49 year old is currently living with a boyfriend which patient does not agree with.  Other stressors for patient are relationship. Danielle Huerta reports infidelity by her husband and having a child out of wedlock.  Patient reports that she just recently found this out in January.  She states that she is trying to work through these things with her husband and they are progressing but she does not know how the relationship will turn out.  Patient also reports this is all going on while her mother got diagnosed with cancer and she has a responsibility to take her mother to all her appointments.  Intervention/plan: LCSW  utilized education on boundary setting.  LCSW spoke with patient today about knowing her limits.  LCSW educated patient on not crossing other peoples boundaries such as her goddaughter's.  LCSW educated patient on the difference between being supportive but not overbearing.  LCSW utilized psychoanalytic therapy for patient to express thoughts, feelings and emotions and nonjudgmental environment.   Plan: Return again in 3 weeks. Diagnosis: Adjustment disorder with mixed anxiety and depressed mood Collaboration of Care: Other None today   Patient/Guardian was advised Release of Information must be obtained prior to any record release in order to collaborate their care with an outside provider. Patient/Guardian was advised if they have not already done so to contact the registration department to sign all necessary forms in order for [place] to release information regarding their care.   Consent: Patient/Guardian gives verbal consent for treatment and assignment of benefits for services provided during this visit. Patient/Guardian expressed understanding and agreed to proceed.   Weber Cooks, LCSW 12/07/2023

## 2023-12-28 ENCOUNTER — Ambulatory Visit (HOSPITAL_COMMUNITY): Payer: Medicaid Other | Admitting: Licensed Clinical Social Worker

## 2023-12-28 DIAGNOSIS — F4323 Adjustment disorder with mixed anxiety and depressed mood: Secondary | ICD-10-CM

## 2023-12-28 NOTE — Progress Notes (Signed)
 THERAPIST PROGRESS NOTE  Virtual Visit via Video Note  I connected with Carrie Mew on 12/28/23 at 10:00 AM EDT by a video enabled telemedicine application and verified that I am speaking with the correct person using two identifiers.  Location: Patient: [facility]  Provider: Providers Home    I discussed the limitations of evaluation and management by telemedicine and the availability of in person appointments. The patient expressed understanding and agreed to proceed.     I discussed the assessment and treatment plan with the patient. The patient was provided an opportunity to ask questions and all were answered. The patient agreed with the plan and demonstrated an understanding of the instructions.   The patient was advised to call back or seek an in-person evaluation if the symptoms worsen or if the condition fails to improve as anticipated.  I provided 30 minutes of non-face-to-face time during this encounter.   Weber Cooks, LCSW   Participation Level: Active  Behavioral Response: CasualAlertAnxious and Depressed  Type of Therapy: Individual Therapy  Treatment Goals addressed:  Active     Anxiety     STG: Hannahgrace will practice problem solving skills 3 times per week for the next 4 weeks.  (Progressing)     Start:  11/09/23    Expected End:  03/28/24         Identify 3 triggers for anxiety (Progressing)     Start:  11/09/23    Expected End:  03/28/24         Identify 3 coping skills for anxiety (Progressing)     Start:  11/09/23    Expected End:  03/28/24         Encourage Clabe Seal to take psychotropic medication(s) as prescribed     Start:  11/09/23         Discuss risks and benefits of medication treatment options for this problem and prescribe as indicated (Completed)     Start:  11/09/23    End:  12/07/23      Work with Clabe Seal to track symptoms, triggers, and/or skill use through a mood chart, diary card, or journal (Completed)      Start:  11/09/23    End:  12/07/23      Perform psychoeducation regarding anxiety disorders (Completed)     Start:  11/09/23    End:  12/28/23         ProgressTowards Goals: Progressing  Interventions: CBT, Motivational Interviewing, and Supportive   Suicidal/Homicidal: Nowithout intent/plan  Therapist Response:   Tishira was alert and oriented x 5.  She was pleasant, cooperative, maintained good eye contact.  She engaged well in therapy session was dressed casually.  She presented today with euthymic mood\affect.  Patient reports everything is going "well".  She reports that things have improved with her partner who committed infidelity and had a child out of wedlock.  Patient reports that they are communicating now and figuring out how to coparent in the situation.    Patient also reports that she is processing well through her grief and loss.  She reports that her best friend passed away and patient is the godmother to her children.  Patient reports that she has been being supportive of her godchildren and not trying to be pushy or direct them and how to handle things.  Patient reports that with guidance versus being direct this has worked well.  LCSW utilized psychoanalytic therapy for patient to express thoughts, feelings and emotions in session utilizing nonjudgmental stance.  LCSW utilize person Center therapy for empowerment.  LCSW educated patient on clear and concise communication to help decrease tension and worry between her and her partner.  LCSW educated patient on authoritarian parenting style to help direct children.  LCSW utilized CBT for cognitive restructuring.    Plan: Return again in 3 weeks.  Diagnosis: No diagnosis found.  Collaboration of Care: Other None today   Patient/Guardian was advised Release of Information must be obtained prior to any record release in order to collaborate their care with an outside provider. Patient/Guardian was advised if they have  not already done so to contact the registration department to sign all necessary forms in order for [place] to release information regarding their care.   Consent: Patient/Guardian gives verbal consent for treatment and assignment of benefits for services provided during this visit. Patient/Guardian expressed understanding and agreed to proceed.   Weber Cooks, LCSW 12/28/2023

## 2024-01-25 ENCOUNTER — Ambulatory Visit (HOSPITAL_COMMUNITY): Admitting: Licensed Clinical Social Worker

## 2024-01-25 DIAGNOSIS — F4323 Adjustment disorder with mixed anxiety and depressed mood: Secondary | ICD-10-CM

## 2024-01-25 NOTE — Progress Notes (Signed)
 THERAPIST PROGRESS NOTE  Virtual Visit via Video Note  I connected with Kee Pastel on 01/25/24 at 10:00 AM EDT by a video enabled telemedicine application and verified that I am speaking with the correct person using two identifiers.  Location: Patient: [facility]  Provider: Providers Home    I discussed the limitations of evaluation and management by telemedicine and the availability of in person appointments. The patient expressed understanding and agreed to proceed.   I discussed the assessment and treatment plan with the patient. The patient was provided an opportunity to ask questions and all were answered. The patient agreed with the plan and demonstrated an understanding of the instructions.   The patient was advised to call back or seek an in-person evaluation if the symptoms worsen or if the condition fails to improve as anticipated.  I provided 30 minutes of non-face-to-face time during this encounter.   Maryagnes Small, LCSW   Participation Level: Active  Behavioral Response: CasualAlertAnxious  Type of Therapy: Individual Therapy  Treatment Goals addressed:  Active     Anxiety     STG: Owen will practice problem solving skills 3 times per week for the next 4 weeks.  (Progressing)     Start:  11/09/23    Expected End:  03/28/24         Identify 3 triggers for anxiety (Progressing)     Start:  11/09/23    Expected End:  03/28/24         Identify 3 coping skills for anxiety (Progressing)     Start:  11/09/23    Expected End:  03/28/24         Encourage Glendell Landry to take psychotropic medication(s) as prescribed     Start:  11/09/23         Discuss risks and benefits of medication treatment options for this problem and prescribe as indicated (Completed)     Start:  11/09/23    End:  12/07/23      Work with Glendell Landry to track symptoms, triggers, and/or skill use through a mood chart, diary card, or journal (Completed)     Start:  11/09/23     End:  12/07/23      Perform psychoeducation regarding anxiety disorders (Completed)     Start:  11/09/23    End:  12/28/23         ProgressTowards Goals: Progressing  Interventions: CBT and Supportive   Suicidal/Homicidal: Nowithout intent/plan  Therapist Response:   Patient was alert and oriented x 5.  She was pleasant, cooperative, maintained good eye contact.  She engaged well in therapy session was dressed casually.  Patient reports today primary stressors as relationship.  She reports infidelity by her partner where she found out several months ago that he cheated on her and had a child out of wedlock.  Patient reports frustration as she did not know about this and reports that she needs to make a decision of whether to stay with him or not.  Patient believes that benefits of staying with him are only for the children and not for the balance of the relationship for herself.  Patient reports that she did not trust him and does not feel like the trust can be around back.  Patient says that they have not communicated in depth about their relationship since the infidelity other than they continue to live with one another.  Patient reports that they need to have this conversation sooner rather than later for not  only themselves but for the kids.  LCSW utilized interventions for supportive therapy for praise and encouragement.  LCSW spoke with patient today about good communication techniques such as finding a quiet place, tone of voice, and clear\direct communication styles.  LCSW educated patient on honesty about her feelings.  LCSW and patient spoke today about the pros and cons of staying with their significant other.  LCSW and patient spoke today on triggers for depression and anxiety.    Plan: Return again in 3 weeks.  Diagnosis: Adjustment disorder with mixed anxiety and depressed mood  Collaboration of Care: Other None today   Patient/Guardian was advised Release of  Information must be obtained prior to any record release in order to collaborate their care with an outside provider. Patient/Guardian was advised if they have not already done so to contact the registration department to sign all necessary forms in order for us  to release information regarding their care.   Consent: Patient/Guardian gives verbal consent for treatment and assignment of benefits for services provided during this visit. Patient/Guardian expressed understanding and agreed to proceed.   Maryagnes Small, LCSW 01/25/2024

## 2024-02-22 ENCOUNTER — Encounter (HOSPITAL_COMMUNITY): Payer: Self-pay

## 2024-02-22 ENCOUNTER — Ambulatory Visit (HOSPITAL_COMMUNITY): Admitting: Licensed Clinical Social Worker

## 2024-03-21 ENCOUNTER — Ambulatory Visit (HOSPITAL_COMMUNITY): Admitting: Licensed Clinical Social Worker

## 2024-03-21 DIAGNOSIS — F4323 Adjustment disorder with mixed anxiety and depressed mood: Secondary | ICD-10-CM

## 2024-03-21 NOTE — Progress Notes (Signed)
 THERAPIST PROGRESS NOTE  Virtual Visit via Video Note  I connected with Danielle Huerta on 03/21/24 at 11:00 AM EDT by a video enabled telemedicine application and verified that I am speaking with the correct person using two identifiers.  Location: Patient: Danielle Huerta  Provider: [facility] Home    I discussed the limitations of evaluation and management by telemedicine and the availability of in person appointments. The patient expressed understanding and agreed to proceed.    I discussed the assessment and treatment plan with the patient. The patient was provided an opportunity to ask questions and all were answered. The patient agreed with the plan and demonstrated an understanding of the instructions.   The patient was advised to call back or seek an in-person evaluation if the symptoms worsen or if the condition fails to improve as anticipated.  I provided 20 minutes of non-face-to-face time during this encounter.   Juliene GORMAN Patee, LCSW   Participation Level: Active  Behavioral Response: CasualAlertAnxious and Depressed  Type of Therapy: Individual Therapy  Treatment Goals addressed:  Active     Anxiety     STG: Danielle Huerta will practice problem solving skills 3 times per week for the next 4 weeks.  (Progressing)     Start:  11/09/23    Expected End:  03/28/24         Identify 3 triggers for anxiety (Progressing)     Start:  11/09/23    Expected End:  03/28/24         Identify 3 coping skills for anxiety (Progressing)     Start:  11/09/23    Expected End:  03/28/24         Encourage Danielle to take psychotropic medication(s) as prescribed     Start:  11/09/23         Discuss risks and benefits of medication treatment options for this problem and prescribe as indicated (Completed)     Start:  11/09/23    End:  12/07/23      Work with Danielle to track symptoms, triggers, and/or skill use through a mood chart, diary card, or journal (Completed)      Start:  11/09/23    End:  12/07/23      Perform psychoeducation regarding anxiety disorders (Completed)     Start:  11/09/23    End:  12/28/23         ProgressTowards Goals: Progressing  Interventions: CBT and Motivational Interviewing  Suicidal/Homicidal: Nowithout intent/plan  Therapist Response:    Danielle Huerta was alert and oriented x 5.  He was alert and oriented x 5.  She was pleasant, cooperative, maintained good eye contact.  Patient engaged well in therapy session was dressed casually.  She presented with depressed and anxious mood\affect.  Patient comes in stating everything has been going good.  She reports decreased stress due to her mother's cancer in remission.  Danielle Huerta reports that she is handling things and communicating with her partner about having a child out of wedlock.  Danielle Huerta has some tension and worry about when this child out of wedlock will eventually meet their children.  Patient reports that at this time they are just going through things and trying to get things under control at home.  Patient endorses a decrease in depressive and anxious symptoms as evidenced by decreased stressors for improved communication with partner and mother's health improving.  Intervention/plan: LCSW utilized psychoanalytic therapy for patient to express thoughts, feelings and concerns and nonjudgmental environment.  LCSW utilized  supportive therapy for praise and encouragement.  LCSW utilized motivational interviewing for open-ended questions and reflective listening.  LCSW administered GAD-7.  LCSW administered a PHQ-9.  LCSW reviewed those scores with the patient.  Plan for patient is if stressors continue to decrease discharge from therapy sessions next visit    Plan: Return again in 4 weeks.  Diagnosis: Adjustment disorder with mixed anxiety and depressed mood  Collaboration of Care: Other None today   Patient/Guardian was advised Release of Information must be obtained prior  to any record release in order to collaborate their care with an outside provider. Patient/Guardian was advised if they have not already done so to contact the registration department to sign all necessary forms in order for us  to release information regarding their care.   Consent: Patient/Guardian gives verbal consent for treatment and assignment of benefits for services provided during this visit. Patient/Guardian expressed understanding and agreed to proceed.   Juliene GORMAN Patee, LCSW 03/21/2024

## 2024-04-18 ENCOUNTER — Ambulatory Visit (HOSPITAL_COMMUNITY): Admitting: Licensed Clinical Social Worker

## 2024-04-18 DIAGNOSIS — F4323 Adjustment disorder with mixed anxiety and depressed mood: Secondary | ICD-10-CM

## 2024-04-18 NOTE — Progress Notes (Signed)
 THERAPIST PROGRESS NOTE Virtual Visit via Video Note  I connected with Danielle Huerta on 04/18/24 at  2:00 PM EDT by a video enabled telemedicine application and verified that I am speaking with the correct person using two identifiers.  Location: Patient: [facility]  Provider: Providers Home office    I discussed the limitations of evaluation and management by telemedicine and the availability of in person appointments. The patient expressed understanding and agreed to proceed.     I discussed the assessment and treatment plan with the patient. The patient was provided an opportunity to ask questions and all were answered. The patient agreed with the plan and demonstrated an understanding of the instructions.   The patient was advised to call back or seek an in-person evaluation if the symptoms worsen or if the condition fails to improve as anticipated.  I provided 30 minutes of non-face-to-face time during this encounter.   Danielle GORMAN Patee, LCSW  Participation Level: Active  Behavioral Response: CasualAlertAnxious and Depressed  Type of Therapy: Individual Therapy  Treatment Goals addressed:  Active     Anxiety     STG: [name] will practice problem solving skills 3 times per week for the next 4 weeks.  (Progressing)     Start:  11/09/23    Expected End:  08/23/24         Identify 3 triggers for anxiety (Progressing)     Start:  11/09/23    Expected End:  08/23/24         Identify 3 coping skills for anxiety (Progressing)     Start:  11/09/23    Expected End:  08/23/24         Encourage Danielle to take psychotropic medication(s) as prescribed     Start:  11/09/23         Discuss risks and benefits of medication treatment options for this problem and prescribe as indicated (Completed)     Start:  11/09/23    End:  12/07/23      Work with Danielle to track symptoms, triggers, and/or skill use through a mood chart, diary card, or journal (Completed)      Start:  11/09/23    End:  12/07/23      Perform psychoeducation regarding anxiety disorders (Completed)     Start:  11/09/23    End:  12/28/23         ProgressTowards Goals: Progressing  Interventions: CBT, Motivational Interviewing, and Supportive   Suicidal/Homicidal: Nowithout intent/plan  Therapist Response:   Patient was alert and oriented x 5.  She was pleasant, cooperative, maintained good eye contact.  Patient engaged well in therapy session with dressed casually.  She presented with euthymic mood\affect.  Danielle Huerta reports everything is going well.  She states that things have improved in her relationship after her spouse cheated on her.  Patient states that her mother is in remission from her cancer.  Danielle Huerta reports that her mental health has greatly improved as evidenced by self reporting mental health being below 50% when she started therapy and being at 90% here today.  She reports an overall decrease in symptoms such as worthlessness, hopelessness, tension, and worry.  Intervention/plan: To LCSW utilized psychoanalytic therapy for patient to express thoughts, feelings, and emotions and nonjudgmental environment.  LCSW utilized supportive therapy for praise and encouragement.  LCSW use open-ended questions and reflective listening for motivational interviewing.  LCSW educated patient on the use of coping skills for anxiety and depression such as deep  breathing, exercise, and progressive muscle relaxation.  Plan for patient will be to follow up with LCSW in 2 months to show stabilization above 90% as self-reported where her mental health is currently at.   Plan: Return again in 4 weeks.  Diagnosis: Adjustment disorder with mixed anxiety and depressed mood  Collaboration of Care: Other None today   Patient/Guardian was advised Release of Information must be obtained prior to any record release in order to collaborate their care with an outside provider.  Patient/Guardian was advised if they have not already done so to contact the registration department to sign all necessary forms in order for us  to release information regarding their care.   Consent: Patient/Guardian gives verbal consent for treatment and assignment of benefits for services provided during this visit. Patient/Guardian expressed understanding and agreed to proceed.   Danielle GORMAN Patee, LCSW 04/18/2024

## 2024-06-27 ENCOUNTER — Ambulatory Visit (HOSPITAL_COMMUNITY): Admitting: Licensed Clinical Social Worker

## 2024-06-27 ENCOUNTER — Telehealth (HOSPITAL_COMMUNITY): Payer: Self-pay | Admitting: Licensed Clinical Social Worker

## 2024-06-27 ENCOUNTER — Encounter (HOSPITAL_COMMUNITY): Payer: Self-pay

## 2024-06-27 NOTE — Telephone Encounter (Signed)
 LCSW sent to links the patient's phone with no response.  LCSW followed up with a phone call.  LCSW had to leave HIPAA compliant message.  LCSW waited until 3:22 PM to disconnect from 3 PM virtual appointment for therapy.  Patient to be marked as no-show for today's session.
